# Patient Record
Sex: Female | Born: 1970 | Race: White | Hispanic: No | Marital: Married | Smoking: Never smoker
Health system: Southern US, Community
[De-identification: ages and names within clinical notes are randomized; demographics above are authoritative.]

## PROBLEM LIST (undated history)

## (undated) DIAGNOSIS — E079 Disorder of thyroid, unspecified: Secondary | ICD-10-CM

## (undated) DIAGNOSIS — I10 Essential (primary) hypertension: Secondary | ICD-10-CM

## (undated) DIAGNOSIS — D649 Anemia, unspecified: Secondary | ICD-10-CM

## (undated) DIAGNOSIS — C8190 Hodgkin lymphoma, unspecified, unspecified site: Principal | ICD-10-CM

## (undated) DIAGNOSIS — I499 Cardiac arrhythmia, unspecified: Secondary | ICD-10-CM

## (undated) HISTORY — DX: Cardiac arrhythmia, unspecified: I49.9

## (undated) HISTORY — PX: BREAST BIOPSY: SHX20

## (undated) HISTORY — DX: Hodgkin lymphoma, unspecified, unspecified site: C81.90

## (undated) HISTORY — DX: Essential (primary) hypertension: I10

## (undated) HISTORY — DX: Disorder of thyroid, unspecified: E07.9

## (undated) HISTORY — DX: Anemia, unspecified: D64.9

---

## 1997-09-26 ENCOUNTER — Inpatient Hospital Stay (HOSPITAL_COMMUNITY): Admission: AD | Admit: 1997-09-26 | Discharge: 1997-09-28 | Payer: Self-pay | Admitting: Obstetrics and Gynecology

## 1997-10-04 ENCOUNTER — Encounter: Admission: RE | Admit: 1997-10-04 | Discharge: 1998-01-02 | Payer: Self-pay | Admitting: Obstetrics and Gynecology

## 1997-10-29 ENCOUNTER — Other Ambulatory Visit: Admission: RE | Admit: 1997-10-29 | Discharge: 1997-10-29 | Payer: Self-pay | Admitting: Obstetrics and Gynecology

## 1998-01-08 ENCOUNTER — Encounter (HOSPITAL_COMMUNITY): Admission: RE | Admit: 1998-01-08 | Discharge: 1998-04-08 | Payer: Self-pay | Admitting: *Deleted

## 1998-12-16 ENCOUNTER — Other Ambulatory Visit: Admission: RE | Admit: 1998-12-16 | Discharge: 1998-12-16 | Payer: Self-pay | Admitting: Obstetrics and Gynecology

## 2000-02-15 ENCOUNTER — Other Ambulatory Visit: Admission: RE | Admit: 2000-02-15 | Discharge: 2000-02-15 | Payer: Self-pay | Admitting: Obstetrics and Gynecology

## 2000-02-23 ENCOUNTER — Encounter: Payer: Self-pay | Admitting: Obstetrics and Gynecology

## 2000-02-23 ENCOUNTER — Ambulatory Visit (HOSPITAL_COMMUNITY): Admission: RE | Admit: 2000-02-23 | Discharge: 2000-02-23 | Payer: Self-pay | Admitting: Obstetrics and Gynecology

## 2000-07-24 ENCOUNTER — Ambulatory Visit (HOSPITAL_COMMUNITY): Admission: RE | Admit: 2000-07-24 | Discharge: 2000-07-24 | Payer: Self-pay | Admitting: Obstetrics and Gynecology

## 2000-10-04 ENCOUNTER — Inpatient Hospital Stay (HOSPITAL_COMMUNITY): Admission: AD | Admit: 2000-10-04 | Discharge: 2000-10-06 | Payer: Self-pay | Admitting: Obstetrics and Gynecology

## 2001-01-22 ENCOUNTER — Other Ambulatory Visit: Admission: RE | Admit: 2001-01-22 | Discharge: 2001-01-22 | Payer: Self-pay | Admitting: Obstetrics and Gynecology

## 2002-04-15 ENCOUNTER — Other Ambulatory Visit: Admission: RE | Admit: 2002-04-15 | Discharge: 2002-04-15 | Payer: Self-pay | Admitting: Obstetrics and Gynecology

## 2003-04-27 ENCOUNTER — Other Ambulatory Visit: Admission: RE | Admit: 2003-04-27 | Discharge: 2003-04-27 | Payer: Self-pay | Admitting: Obstetrics and Gynecology

## 2004-05-12 ENCOUNTER — Other Ambulatory Visit: Admission: RE | Admit: 2004-05-12 | Discharge: 2004-05-12 | Payer: Self-pay | Admitting: Obstetrics and Gynecology

## 2004-06-16 ENCOUNTER — Ambulatory Visit: Payer: Self-pay | Admitting: Family Medicine

## 2004-12-25 ENCOUNTER — Ambulatory Visit: Payer: Self-pay | Admitting: Family Medicine

## 2005-02-02 ENCOUNTER — Ambulatory Visit: Payer: Self-pay | Admitting: Family Medicine

## 2005-06-05 ENCOUNTER — Other Ambulatory Visit: Admission: RE | Admit: 2005-06-05 | Discharge: 2005-06-05 | Payer: Self-pay | Admitting: Obstetrics and Gynecology

## 2005-06-08 ENCOUNTER — Ambulatory Visit: Payer: Self-pay | Admitting: Family Medicine

## 2005-08-20 ENCOUNTER — Ambulatory Visit: Payer: Self-pay | Admitting: Family Medicine

## 2005-09-10 ENCOUNTER — Ambulatory Visit: Payer: Self-pay | Admitting: Family Medicine

## 2006-07-03 ENCOUNTER — Encounter: Admission: RE | Admit: 2006-07-03 | Discharge: 2006-07-03 | Payer: Self-pay | Admitting: Obstetrics and Gynecology

## 2006-07-03 ENCOUNTER — Encounter (INDEPENDENT_AMBULATORY_CARE_PROVIDER_SITE_OTHER): Payer: Self-pay | Admitting: *Deleted

## 2006-07-05 ENCOUNTER — Ambulatory Visit: Payer: Self-pay | Admitting: Hematology & Oncology

## 2006-07-12 ENCOUNTER — Ambulatory Visit (HOSPITAL_COMMUNITY): Admission: RE | Admit: 2006-07-12 | Discharge: 2006-07-12 | Payer: Self-pay | Admitting: Hematology & Oncology

## 2006-07-23 LAB — CBC WITH DIFFERENTIAL/PLATELET
BASO%: 1.5 % (ref 0.0–2.0)
Basophils Absolute: 0.1 10*3/uL (ref 0.0–0.1)
EOS%: 2.1 % (ref 0.0–7.0)
HGB: 13.1 g/dL (ref 11.6–15.9)
MCH: 29.5 pg (ref 26.0–34.0)
MCHC: 33 g/dL (ref 32.0–36.0)
MONO%: 9.3 % (ref 0.0–13.0)
RBC: 4.43 10*6/uL (ref 3.70–5.32)
RDW: 11.9 % (ref 11.3–14.5)
lymph#: 1.4 10*3/uL (ref 0.9–3.3)

## 2006-07-23 LAB — COMPREHENSIVE METABOLIC PANEL
ALT: 12 U/L (ref 0–35)
AST: 11 U/L (ref 0–37)
Albumin: 4.6 g/dL (ref 3.5–5.2)
Alkaline Phosphatase: 59 U/L (ref 39–117)
Calcium: 9.6 mg/dL (ref 8.4–10.5)
Chloride: 102 mEq/L (ref 96–112)
Potassium: 4.2 mEq/L (ref 3.5–5.3)
Sodium: 138 mEq/L (ref 135–145)
Total Protein: 7.4 g/dL (ref 6.0–8.3)

## 2006-07-24 ENCOUNTER — Ambulatory Visit (HOSPITAL_COMMUNITY): Admission: RE | Admit: 2006-07-24 | Discharge: 2006-07-24 | Payer: Self-pay | Admitting: Hematology & Oncology

## 2006-08-08 LAB — BASIC METABOLIC PANEL
BUN: 8 mg/dL (ref 6–23)
Calcium: 9.1 mg/dL (ref 8.4–10.5)
Glucose, Bld: 94 mg/dL (ref 70–99)
Potassium: 4 mEq/L (ref 3.5–5.3)
Sodium: 140 mEq/L (ref 135–145)

## 2006-08-08 LAB — CBC WITH DIFFERENTIAL/PLATELET
EOS%: 1.4 % (ref 0.0–7.0)
LYMPH%: 59.7 % — ABNORMAL HIGH (ref 14.0–48.0)
MCH: 30.5 pg (ref 26.0–34.0)
MCV: 87.5 fL (ref 81.0–101.0)
MONO%: 23.3 % — ABNORMAL HIGH (ref 0.0–13.0)
Platelets: 323 10*3/uL (ref 145–400)
RBC: 3.85 10*6/uL (ref 3.70–5.32)
RDW: 11.9 % (ref 11.3–14.5)

## 2006-08-08 LAB — LACTATE DEHYDROGENASE: LDH: 133 U/L (ref 94–250)

## 2006-08-19 ENCOUNTER — Ambulatory Visit: Payer: Self-pay | Admitting: Hematology & Oncology

## 2006-08-22 LAB — CBC WITH DIFFERENTIAL/PLATELET
BASO%: 1.4 % (ref 0.0–2.0)
EOS%: 0.5 % (ref 0.0–7.0)
HCT: 37 % (ref 34.8–46.6)
HGB: 12.8 g/dL (ref 11.6–15.9)
MCH: 30.8 pg (ref 26.0–34.0)
MCHC: 34.7 g/dL (ref 32.0–36.0)
MONO#: 0.9 10*3/uL (ref 0.1–0.9)
NEUT%: 70.1 % (ref 39.6–76.8)
RDW: 12 % (ref 11.3–14.5)
WBC: 14.1 10*3/uL — ABNORMAL HIGH (ref 3.9–10.0)
lymph#: 3.1 10*3/uL (ref 0.9–3.3)

## 2006-09-05 LAB — CBC WITH DIFFERENTIAL/PLATELET
BASO%: 1.7 % (ref 0.0–2.0)
EOS%: 1.6 % (ref 0.0–7.0)
HCT: 35.6 % (ref 34.8–46.6)
LYMPH%: 17.5 % (ref 14.0–48.0)
MCH: 30.1 pg (ref 26.0–34.0)
MCHC: 33.9 g/dL (ref 32.0–36.0)
MCV: 88.7 fL (ref 81.0–101.0)
MONO#: 0.8 10*3/uL (ref 0.1–0.9)
MONO%: 5.6 % (ref 0.0–13.0)
NEUT%: 73.6 % (ref 39.6–76.8)
Platelets: 305 10*3/uL (ref 145–400)
RBC: 4.02 10*6/uL (ref 3.70–5.32)
WBC: 14.6 10*3/uL — ABNORMAL HIGH (ref 3.9–10.0)

## 2006-09-18 LAB — CBC WITH DIFFERENTIAL/PLATELET
BASO%: 0.3 % (ref 0.0–2.0)
EOS%: 0.6 % (ref 0.0–7.0)
HCT: 33.7 % — ABNORMAL LOW (ref 34.8–46.6)
MCH: 30.9 pg (ref 26.0–34.0)
MCHC: 34.6 g/dL (ref 32.0–36.0)
MONO#: 0.8 10*3/uL (ref 0.1–0.9)
NEUT%: 88.1 % — ABNORMAL HIGH (ref 39.6–76.8)
RBC: 3.78 10*6/uL (ref 3.70–5.32)
WBC: 27.5 10*3/uL — ABNORMAL HIGH (ref 3.9–10.0)
lymph#: 2.2 10*3/uL (ref 0.9–3.3)

## 2006-09-18 LAB — LACTATE DEHYDROGENASE: LDH: 266 U/L — ABNORMAL HIGH (ref 94–250)

## 2006-09-30 ENCOUNTER — Ambulatory Visit: Payer: Self-pay | Admitting: Hematology & Oncology

## 2006-10-03 LAB — CBC WITH DIFFERENTIAL/PLATELET
EOS%: 1.6 % (ref 0.0–7.0)
Eosinophils Absolute: 0.5 10*3/uL (ref 0.0–0.5)
HGB: 12.4 g/dL (ref 11.6–15.9)
MCH: 30.8 pg (ref 26.0–34.0)
MCV: 90 fL (ref 81.0–101.0)
MONO%: 5.3 % (ref 0.0–13.0)
NEUT#: 22.8 10*3/uL — ABNORMAL HIGH (ref 1.5–6.5)
RBC: 4.02 10*6/uL (ref 3.70–5.32)
RDW: 15.9 % — ABNORMAL HIGH (ref 11.3–14.5)
lymph#: 3.1 10*3/uL (ref 0.9–3.3)

## 2006-10-21 ENCOUNTER — Ambulatory Visit (HOSPITAL_COMMUNITY): Admission: RE | Admit: 2006-10-21 | Discharge: 2006-10-21 | Payer: Self-pay | Admitting: Hematology & Oncology

## 2006-10-28 LAB — CBC WITH DIFFERENTIAL/PLATELET
Basophils Absolute: 0.2 10*3/uL — ABNORMAL HIGH (ref 0.0–0.1)
Eosinophils Absolute: 0.1 10*3/uL (ref 0.0–0.5)
HGB: 11.1 g/dL — ABNORMAL LOW (ref 11.6–15.9)
MONO#: 1.4 10*3/uL — ABNORMAL HIGH (ref 0.1–0.9)
NEUT#: 7.3 10*3/uL — ABNORMAL HIGH (ref 1.5–6.5)
RBC: 3.8 10*6/uL (ref 3.70–5.32)
RDW: 14.5 % (ref 11.3–14.5)
WBC: 11.2 10*3/uL — ABNORMAL HIGH (ref 3.9–10.0)

## 2006-11-04 ENCOUNTER — Ambulatory Visit: Admission: RE | Admit: 2006-11-04 | Discharge: 2006-12-22 | Payer: Self-pay | Admitting: Radiation Oncology

## 2006-11-18 ENCOUNTER — Ambulatory Visit: Payer: Self-pay | Admitting: Hematology & Oncology

## 2006-12-09 LAB — COMPREHENSIVE METABOLIC PANEL
BUN: 11 mg/dL (ref 6–23)
CO2: 25 mEq/L (ref 19–32)
Calcium: 8.6 mg/dL (ref 8.4–10.5)
Chloride: 106 mEq/L (ref 96–112)
Creatinine, Ser: 0.61 mg/dL (ref 0.40–1.20)

## 2006-12-09 LAB — CBC WITH DIFFERENTIAL/PLATELET
Basophils Absolute: 0 10*3/uL (ref 0.0–0.1)
Eosinophils Absolute: 0.3 10*3/uL (ref 0.0–0.5)
HCT: 35.2 % (ref 34.8–46.6)
HGB: 12.1 g/dL (ref 11.6–15.9)
LYMPH%: 28.6 % (ref 14.0–48.0)
MONO#: 0.3 10*3/uL (ref 0.1–0.9)
NEUT#: 2.2 10*3/uL (ref 1.5–6.5)
NEUT%: 55.7 % (ref 39.6–76.8)
Platelets: 230 10*3/uL (ref 145–400)
WBC: 3.9 10*3/uL (ref 3.9–10.0)

## 2006-12-09 LAB — LACTATE DEHYDROGENASE: LDH: 151 U/L (ref 94–250)

## 2007-01-06 ENCOUNTER — Ambulatory Visit: Payer: Self-pay | Admitting: Hematology & Oncology

## 2007-01-31 ENCOUNTER — Ambulatory Visit (HOSPITAL_COMMUNITY): Admission: RE | Admit: 2007-01-31 | Discharge: 2007-01-31 | Payer: Self-pay | Admitting: Hematology & Oncology

## 2007-02-03 LAB — CBC WITH DIFFERENTIAL/PLATELET
Basophils Absolute: 0 10*3/uL (ref 0.0–0.1)
EOS%: 4.3 % (ref 0.0–7.0)
HGB: 11.9 g/dL (ref 11.6–15.9)
MCH: 29 pg (ref 26.0–34.0)
NEUT#: 2.4 10*3/uL (ref 1.5–6.5)
RBC: 4.1 10*6/uL (ref 3.70–5.32)
RDW: 15.4 % — ABNORMAL HIGH (ref 11.3–14.5)
lymph#: 1.2 10*3/uL (ref 0.9–3.3)

## 2007-02-03 LAB — COMPREHENSIVE METABOLIC PANEL
ALT: 12 U/L (ref 0–35)
AST: 12 U/L (ref 0–37)
Albumin: 4 g/dL (ref 3.5–5.2)
BUN: 10 mg/dL (ref 6–23)
Calcium: 9 mg/dL (ref 8.4–10.5)
Chloride: 107 mEq/L (ref 96–112)
Potassium: 3.5 mEq/L (ref 3.5–5.3)
Sodium: 142 mEq/L (ref 135–145)
Total Protein: 6.5 g/dL (ref 6.0–8.3)

## 2007-03-30 ENCOUNTER — Ambulatory Visit: Payer: Self-pay | Admitting: Hematology & Oncology

## 2007-05-05 ENCOUNTER — Ambulatory Visit (HOSPITAL_COMMUNITY): Admission: RE | Admit: 2007-05-05 | Discharge: 2007-05-05 | Payer: Self-pay | Admitting: Hematology & Oncology

## 2007-05-12 ENCOUNTER — Ambulatory Visit: Payer: Self-pay | Admitting: Hematology & Oncology

## 2007-05-12 LAB — CBC WITH DIFFERENTIAL/PLATELET
Basophils Absolute: 0 10*3/uL (ref 0.0–0.1)
EOS%: 4.9 % (ref 0.0–7.0)
HGB: 13.6 g/dL (ref 11.6–15.9)
LYMPH%: 32.2 % (ref 14.0–48.0)
MCH: 31.6 pg (ref 26.0–34.0)
MCV: 89.5 fL (ref 81.0–101.0)
MONO%: 8.5 % (ref 0.0–13.0)
RBC: 4.29 10*6/uL (ref 3.70–5.32)
RDW: 13.3 % (ref 11.3–14.5)

## 2007-05-16 ENCOUNTER — Ambulatory Visit (HOSPITAL_COMMUNITY): Admission: RE | Admit: 2007-05-16 | Discharge: 2007-05-16 | Payer: Self-pay | Admitting: Hematology & Oncology

## 2007-08-04 ENCOUNTER — Ambulatory Visit (HOSPITAL_COMMUNITY): Admission: RE | Admit: 2007-08-04 | Discharge: 2007-08-04 | Payer: Self-pay | Admitting: Hematology & Oncology

## 2007-08-06 ENCOUNTER — Ambulatory Visit: Payer: Self-pay | Admitting: Hematology & Oncology

## 2007-08-11 LAB — ERYTHROCYTE SEDIMENTATION RATE: Sed Rate: 5 mm/hr (ref 0–30)

## 2007-08-11 LAB — CBC WITH DIFFERENTIAL/PLATELET
BASO%: 0.9 % (ref 0.0–2.0)
EOS%: 4.2 % (ref 0.0–7.0)
Eosinophils Absolute: 0.2 10*3/uL (ref 0.0–0.5)
MCH: 30.6 pg (ref 26.0–34.0)
MCHC: 34.3 g/dL (ref 32.0–36.0)
MCV: 89.1 fL (ref 81.0–101.0)
MONO%: 6.3 % (ref 0.0–13.0)
NEUT#: 2 10*3/uL (ref 1.5–6.5)
RBC: 4.5 10*6/uL (ref 3.70–5.32)
RDW: 13.1 % (ref 11.3–14.5)

## 2007-08-11 LAB — LACTATE DEHYDROGENASE: LDH: 132 U/L (ref 94–250)

## 2007-11-03 ENCOUNTER — Ambulatory Visit (HOSPITAL_COMMUNITY): Admission: RE | Admit: 2007-11-03 | Discharge: 2007-11-03 | Payer: Self-pay | Admitting: Hematology & Oncology

## 2007-11-06 ENCOUNTER — Ambulatory Visit: Payer: Self-pay | Admitting: Hematology & Oncology

## 2007-11-10 LAB — CBC WITH DIFFERENTIAL/PLATELET
Eosinophils Absolute: 0.2 10*3/uL (ref 0.0–0.5)
HCT: 36.2 % (ref 34.8–46.6)
LYMPH%: 41.8 % (ref 14.0–48.0)
MONO#: 0.2 10*3/uL (ref 0.1–0.9)
NEUT#: 1.2 10*3/uL — ABNORMAL LOW (ref 1.5–6.5)
NEUT%: 43.9 % (ref 39.6–76.8)
Platelets: 229 10*3/uL (ref 145–400)
WBC: 2.8 10*3/uL — ABNORMAL LOW (ref 3.9–10.0)

## 2007-11-10 LAB — ERYTHROCYTE SEDIMENTATION RATE: Sed Rate: 6 mm/hr (ref 0–30)

## 2007-11-10 LAB — COMPREHENSIVE METABOLIC PANEL
ALT: 9 U/L (ref 0–35)
Albumin: 4.2 g/dL (ref 3.5–5.2)
BUN: 12 mg/dL (ref 6–23)
CO2: 25 mEq/L (ref 19–32)
Calcium: 9.4 mg/dL (ref 8.4–10.5)
Chloride: 105 mEq/L (ref 96–112)
Creatinine, Ser: 0.73 mg/dL (ref 0.40–1.20)
Potassium: 4.1 mEq/L (ref 3.5–5.3)

## 2007-11-10 LAB — LACTATE DEHYDROGENASE: LDH: 117 U/L (ref 94–250)

## 2008-02-09 ENCOUNTER — Ambulatory Visit (HOSPITAL_COMMUNITY): Admission: RE | Admit: 2008-02-09 | Discharge: 2008-02-09 | Payer: Self-pay | Admitting: Hematology & Oncology

## 2008-02-11 ENCOUNTER — Ambulatory Visit: Payer: Self-pay | Admitting: Hematology & Oncology

## 2008-02-12 LAB — COMPREHENSIVE METABOLIC PANEL
BUN: 11 mg/dL (ref 6–23)
CO2: 23 mEq/L (ref 19–32)
Creatinine, Ser: 0.7 mg/dL (ref 0.40–1.20)
Glucose, Bld: 87 mg/dL (ref 70–99)
Total Bilirubin: 0.9 mg/dL (ref 0.3–1.2)

## 2008-04-09 IMAGING — CT NM PET TUM IMG SKULL BASE T - THIGH
4 series · 25 of 25 positions shown · IV contrast ([ID])
Comparison: No prior studies for comparison.

CLINICAL DATA: New diagnosis of Hodgkin?s lymphoma after core biopsy of an enlarged left axillary lymph node.  
 FDG PET-CT TUMOR IMAGING (SKULL BASE TO THIGHS):
 Fasting Blood Glucose:  91.
TECHNIQUE: 14 mCi F-18 FDG were administered via right antecubital fossa.   Full ring PET imaging was performed from the skull base through the mid-thighs 60 minutes after injection.  CT data was obtained and used for attenuation correction and anatomic localization only.  (This was not acquired as a diagnostic CT examination.)

[Series 1: pet ac · axial · 3.3mm · 4.69mm/px · z∈[-870,+0]mm · 8 of 267 slices shown]
[im 1/267]
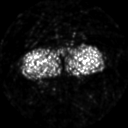
[im 39/267]
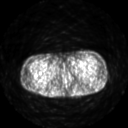
[im 77/267]
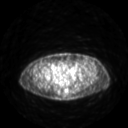
[im 115/267]
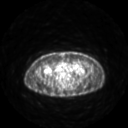
[im 153/267]
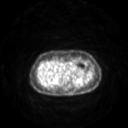
[im 191/267]
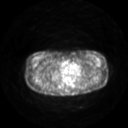
[im 229/267]
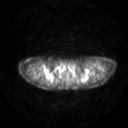
[im 267/267]
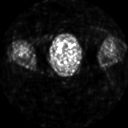

[Series 2: pet nac · axial · 3.3mm · 4.69mm/px · z∈[-870,+0]mm · 8 of 267 slices shown]
[im 1/267]
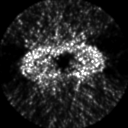
[im 39/267]
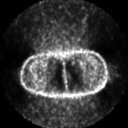
[im 77/267]
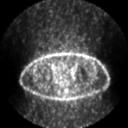
[im 115/267]
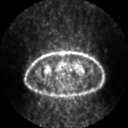
[im 153/267]
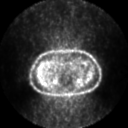
[im 191/267]
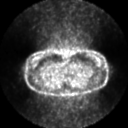
[im 229/267]
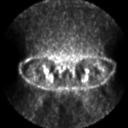
[im 267/267]
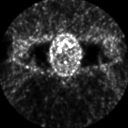

[Series 2: ct images · axial · 3.8mm · 0.98mm/px · z∈[-870,+0]mm · 8 of 267 slices shown]
[im 1/267]
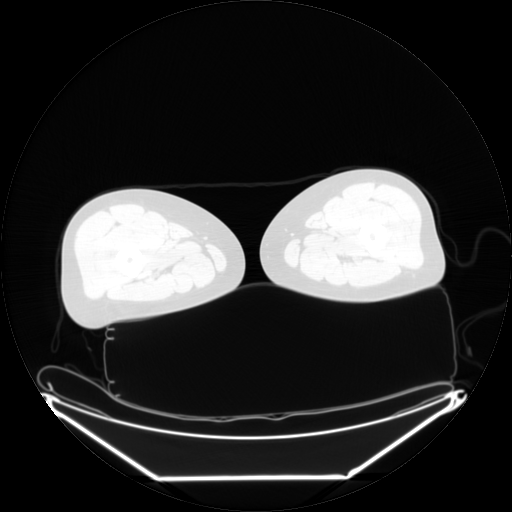
[im 39/267]
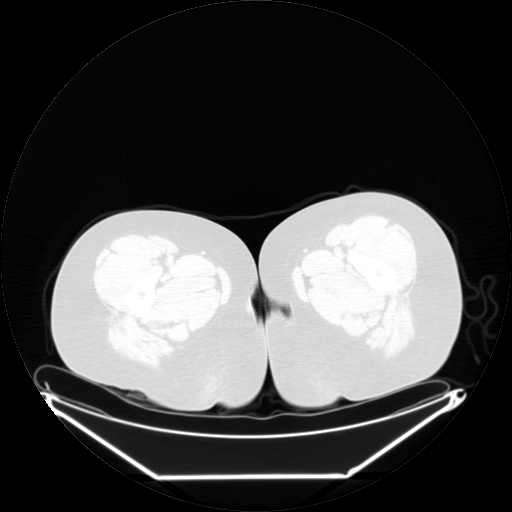
[im 77/267]
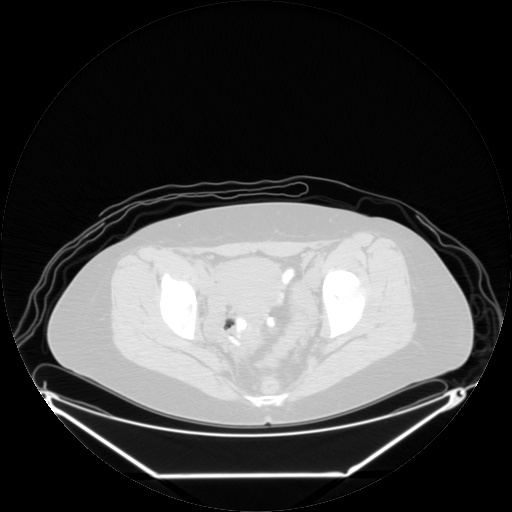
[im 115/267]
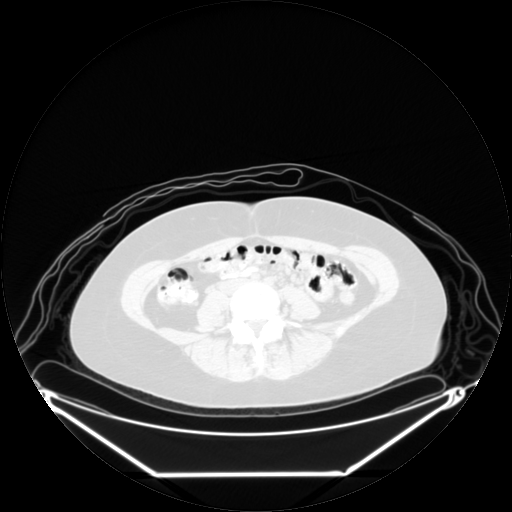
[im 153/267]
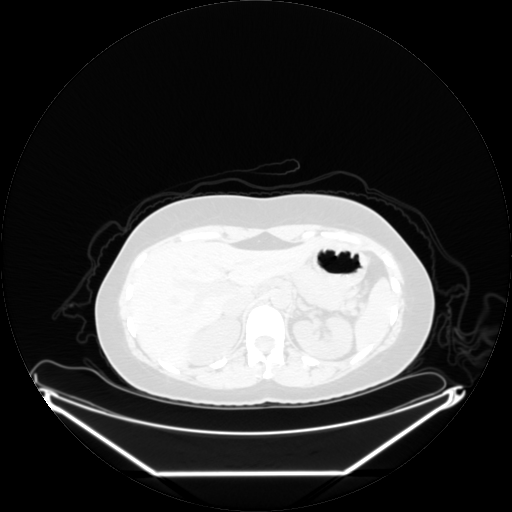
[im 191/267]
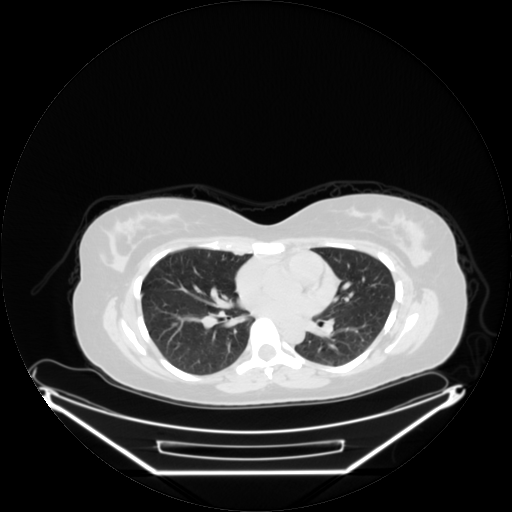
[im 229/267]
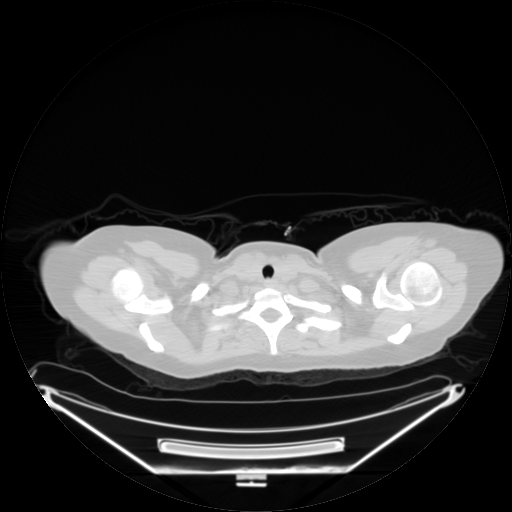
[im 267/267]
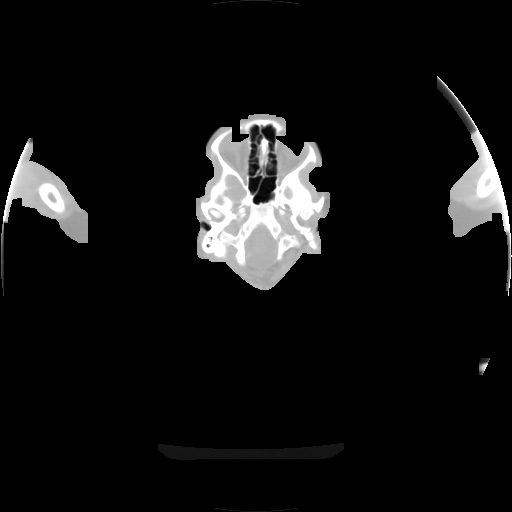

[Series 123: mip · coronal · 3.3mm · 4.69mm/px · 1 of 30 slices shown]
[im 1/30]
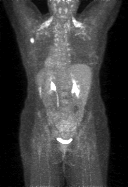

[25 of 25 positions shown; findings below may reference images not displayed]

FINDINGS: There is a dominant enlarged left axillary lymph node present measuring 2.2 cm in greatest transverse dimensions.   This demonstrates increased metabolic activity by PET with SUV max of 5.7.  An adjacent smaller medial axillary node in a subpectoral location measures approximately 1.4 cm in greatest diameter.  This shows no evidence of abnormal metabolic activity by PET with measured SUV max of 1.1.  
 Physiologic activity is seen in brown fat in the supraclavicular regions bilaterally.   No other abnormal lymph node activity is seen in the neck, chest, abdomen or pelvis.  No abnormal solid organ activity is present.   No bony lesions.   No pulmonary masses.  No focal abnormal activity is seen in the GI tract.
IMPRESSION: The only abnormal metabolic activity present is in the dominant enlarged left axillary lymph node with SUV max of 5.7.  A smaller adjacent medial axillary/subpectoral node shows no abnormal metabolic activity by PET.  No other evidence of active lymphoma is seen throughout the rest of the imaged body.

## 2008-06-14 ENCOUNTER — Ambulatory Visit (HOSPITAL_COMMUNITY): Admission: RE | Admit: 2008-06-14 | Discharge: 2008-06-14 | Payer: Self-pay | Admitting: Hematology & Oncology

## 2008-06-16 ENCOUNTER — Ambulatory Visit: Payer: Self-pay | Admitting: Hematology & Oncology

## 2008-06-17 LAB — CBC WITH DIFFERENTIAL (CANCER CENTER ONLY)
BASO%: 0.7 % (ref 0.0–2.0)
Eosinophils Absolute: 0.1 10*3/uL (ref 0.0–0.5)
HCT: 39 % (ref 34.8–46.6)
HGB: 13.4 g/dL (ref 11.6–15.9)
LYMPH#: 1.8 10*3/uL (ref 0.9–3.3)
LYMPH%: 38.5 % (ref 14.0–48.0)
MCV: 89 fL (ref 81–101)
MONO#: 0.4 10*3/uL (ref 0.1–0.9)
NEUT%: 50.7 % (ref 39.6–80.0)
RBC: 4.39 10*6/uL (ref 3.70–5.32)
RDW: 12.4 % (ref 10.5–14.6)
WBC: 4.8 10*3/uL (ref 3.9–10.0)

## 2008-06-17 LAB — COMPREHENSIVE METABOLIC PANEL
ALT: 12 U/L (ref 0–35)
Alkaline Phosphatase: 55 U/L (ref 39–117)
CO2: 25 mEq/L (ref 19–32)
Sodium: 138 mEq/L (ref 135–145)
Total Bilirubin: 0.9 mg/dL (ref 0.3–1.2)
Total Protein: 7 g/dL (ref 6.0–8.3)

## 2008-10-04 ENCOUNTER — Ambulatory Visit (HOSPITAL_COMMUNITY): Admission: RE | Admit: 2008-10-04 | Discharge: 2008-10-04 | Payer: Self-pay | Admitting: Hematology & Oncology

## 2008-10-05 ENCOUNTER — Ambulatory Visit: Payer: Self-pay | Admitting: Hematology & Oncology

## 2008-10-06 LAB — CBC WITH DIFFERENTIAL (CANCER CENTER ONLY)
BASO#: 0 10*3/uL (ref 0.0–0.2)
EOS%: 3.8 % (ref 0.0–7.0)
Eosinophils Absolute: 0.2 10*3/uL (ref 0.0–0.5)
HGB: 13.4 g/dL (ref 11.6–15.9)
LYMPH#: 1.8 10*3/uL (ref 0.9–3.3)
MONO%: 6.5 % (ref 0.0–13.0)
NEUT#: 2.9 10*3/uL (ref 1.5–6.5)
Platelets: 268 10*3/uL (ref 145–400)
RBC: 4.38 10*6/uL (ref 3.70–5.32)

## 2008-10-06 LAB — COMPREHENSIVE METABOLIC PANEL
Albumin: 4.4 g/dL (ref 3.5–5.2)
CO2: 23 mEq/L (ref 19–32)
Glucose, Bld: 69 mg/dL — ABNORMAL LOW (ref 70–99)
Sodium: 136 mEq/L (ref 135–145)
Total Bilirubin: 1 mg/dL (ref 0.3–1.2)
Total Protein: 7 g/dL (ref 6.0–8.3)

## 2008-10-06 LAB — LACTATE DEHYDROGENASE: LDH: 127 U/L (ref 94–250)

## 2009-03-14 ENCOUNTER — Ambulatory Visit (HOSPITAL_COMMUNITY): Admission: RE | Admit: 2009-03-14 | Discharge: 2009-03-14 | Payer: Self-pay | Admitting: Hematology & Oncology

## 2009-03-16 ENCOUNTER — Ambulatory Visit: Payer: Self-pay | Admitting: Hematology & Oncology

## 2009-03-17 LAB — COMPREHENSIVE METABOLIC PANEL
AST: 14 U/L (ref 0–37)
Alkaline Phosphatase: 54 U/L (ref 39–117)
Glucose, Bld: 84 mg/dL (ref 70–99)
Sodium: 137 mEq/L (ref 135–145)
Total Bilirubin: 0.7 mg/dL (ref 0.3–1.2)
Total Protein: 7.2 g/dL (ref 6.0–8.3)

## 2009-03-17 LAB — CBC WITH DIFFERENTIAL (CANCER CENTER ONLY)
BASO%: 0.6 % (ref 0.0–2.0)
EOS%: 3.9 % (ref 0.0–7.0)
LYMPH#: 2.3 10*3/uL (ref 0.9–3.3)
MCH: 30.4 pg (ref 26.0–34.0)
MCHC: 33.7 g/dL (ref 32.0–36.0)
MONO%: 7.1 % (ref 0.0–13.0)
NEUT#: 2.9 10*3/uL (ref 1.5–6.5)
Platelets: 276 10*3/uL (ref 145–400)
RDW: 12.1 % (ref 10.5–14.6)

## 2009-09-12 ENCOUNTER — Ambulatory Visit (HOSPITAL_COMMUNITY): Admission: RE | Admit: 2009-09-12 | Discharge: 2009-09-12 | Payer: Self-pay | Admitting: Hematology & Oncology

## 2009-09-14 ENCOUNTER — Ambulatory Visit: Payer: Self-pay | Admitting: Hematology & Oncology

## 2009-09-15 LAB — CBC WITH DIFFERENTIAL (CANCER CENTER ONLY)
BASO%: 0.5 % (ref 0.0–2.0)
Eosinophils Absolute: 0.2 10*3/uL (ref 0.0–0.5)
HCT: 37 % (ref 34.8–46.6)
HGB: 12.6 g/dL (ref 11.6–15.9)
LYMPH#: 2.4 10*3/uL (ref 0.9–3.3)
MONO#: 0.5 10*3/uL (ref 0.1–0.9)
NEUT%: 49.7 % (ref 39.6–80.0)
RBC: 4.18 10*6/uL (ref 3.70–5.32)
RDW: 11.7 % (ref 10.5–14.6)

## 2009-09-15 LAB — COMPREHENSIVE METABOLIC PANEL
ALT: 13 U/L (ref 0–35)
Albumin: 4.5 g/dL (ref 3.5–5.2)
CO2: 25 mEq/L (ref 19–32)
Calcium: 9 mg/dL (ref 8.4–10.5)
Chloride: 102 mEq/L (ref 96–112)
Creatinine, Ser: 0.73 mg/dL (ref 0.40–1.20)
Glucose, Bld: 78 mg/dL (ref 70–99)
Sodium: 136 mEq/L (ref 135–145)

## 2010-03-15 ENCOUNTER — Ambulatory Visit (HOSPITAL_BASED_OUTPATIENT_CLINIC_OR_DEPARTMENT_OTHER): Payer: BC Managed Care – PPO | Admitting: Hematology & Oncology

## 2010-03-16 LAB — COMPREHENSIVE METABOLIC PANEL
ALT: 17 U/L (ref 0–35)
AST: 15 U/L (ref 0–37)
Alkaline Phosphatase: 50 U/L (ref 39–117)
CO2: 23 mEq/L (ref 19–32)
Calcium: 9 mg/dL (ref 8.4–10.5)
Creatinine, Ser: 0.8 mg/dL (ref 0.40–1.20)
Glucose, Bld: 81 mg/dL (ref 70–99)
Total Bilirubin: 0.6 mg/dL (ref 0.3–1.2)

## 2010-03-16 LAB — CBC WITH DIFFERENTIAL (CANCER CENTER ONLY)
BASO%: 0.8 % (ref 0.0–2.0)
EOS%: 3.7 % (ref 0.0–7.0)
LYMPH#: 2.3 10*3/uL (ref 0.9–3.3)
LYMPH%: 30.6 % (ref 14.0–48.0)
MCHC: 34.3 g/dL (ref 32.0–36.0)
MONO#: 0.6 10*3/uL (ref 0.1–0.9)
Platelets: 312 10*3/uL (ref 145–400)
RDW: 12.4 % (ref 10.5–14.6)
WBC: 7.7 10*3/uL (ref 3.9–10.0)

## 2010-03-16 LAB — VITAMIN D 25 HYDROXY (VIT D DEFICIENCY, FRACTURES): Vit D, 25-Hydroxy: 42 ng/mL (ref 30–89)

## 2010-03-20 ENCOUNTER — Ambulatory Visit (HOSPITAL_COMMUNITY): Admission: RE | Admit: 2010-03-20 | Discharge: 2010-03-20 | Payer: Self-pay | Admitting: Hematology & Oncology

## 2010-07-24 ENCOUNTER — Emergency Department (HOSPITAL_COMMUNITY)
Admission: EM | Admit: 2010-07-24 | Discharge: 2010-07-24 | Payer: Self-pay | Source: Home / Self Care | Admitting: Emergency Medicine

## 2010-08-27 ENCOUNTER — Encounter: Payer: Self-pay | Admitting: Hematology & Oncology

## 2010-09-21 ENCOUNTER — Encounter: Payer: BC Managed Care – PPO | Admitting: Hematology & Oncology

## 2010-09-21 ENCOUNTER — Encounter (HOSPITAL_BASED_OUTPATIENT_CLINIC_OR_DEPARTMENT_OTHER): Payer: BC Managed Care – PPO | Admitting: Hematology & Oncology

## 2010-09-21 DIAGNOSIS — C8114 Nodular sclerosis classical Hodgkin lymphoma, lymph nodes of axilla and upper limb: Secondary | ICD-10-CM

## 2010-09-21 DIAGNOSIS — K7689 Other specified diseases of liver: Secondary | ICD-10-CM

## 2010-09-21 LAB — CBC WITH DIFFERENTIAL (CANCER CENTER ONLY)
BASO#: 0 10*3/uL (ref 0.0–0.2)
EOS%: 4.7 % (ref 0.0–7.0)
HCT: 37.8 % (ref 34.8–46.6)
HGB: 12.7 g/dL (ref 11.6–15.9)
LYMPH%: 34.2 % (ref 14.0–48.0)
MCH: 29.8 pg (ref 26.0–34.0)
MCHC: 33.6 g/dL (ref 32.0–36.0)
MCV: 89 fL (ref 81–101)
MONO%: 7 % (ref 0.0–13.0)
NEUT%: 53.4 % (ref 39.6–80.0)
RDW: 12.1 % (ref 10.5–14.6)

## 2010-09-22 LAB — VITAMIN D 25 HYDROXY (VIT D DEFICIENCY, FRACTURES): Vit D, 25-Hydroxy: 57 ng/mL (ref 30–89)

## 2010-09-22 LAB — COMPREHENSIVE METABOLIC PANEL
ALT: 15 U/L (ref 0–35)
AST: 14 U/L (ref 0–37)
Albumin: 4.4 g/dL (ref 3.5–5.2)
Alkaline Phosphatase: 57 U/L (ref 39–117)
Calcium: 9 mg/dL (ref 8.4–10.5)
Potassium: 3.8 mEq/L (ref 3.5–5.3)
Sodium: 137 mEq/L (ref 135–145)
Total Protein: 7 g/dL (ref 6.0–8.3)

## 2010-10-16 LAB — CBC
Platelets: 271 10*3/uL (ref 150–400)
RDW: 13.3 % (ref 11.5–15.5)
WBC: 5.1 10*3/uL (ref 4.0–10.5)

## 2010-10-16 LAB — POCT PREGNANCY, URINE: Preg Test, Ur: NEGATIVE

## 2010-10-16 LAB — POCT I-STAT, CHEM 8
BUN: 12 mg/dL (ref 6–23)
Chloride: 102 mEq/L (ref 96–112)
Potassium: 5.1 mEq/L (ref 3.5–5.1)
Sodium: 136 mEq/L (ref 135–145)

## 2010-10-16 LAB — D-DIMER, QUANTITATIVE: D-Dimer, Quant: 0.23 ug/mL-FEU (ref 0.00–0.48)

## 2010-10-20 LAB — GLUCOSE, CAPILLARY: Glucose-Capillary: 99 mg/dL (ref 70–99)

## 2010-10-25 LAB — GLUCOSE, CAPILLARY: Glucose-Capillary: 91 mg/dL (ref 70–99)

## 2010-11-11 LAB — GLUCOSE, CAPILLARY: Glucose-Capillary: 96 mg/dL (ref 70–99)

## 2010-11-16 ENCOUNTER — Other Ambulatory Visit: Payer: Self-pay | Admitting: Surgery

## 2010-11-16 LAB — GLUCOSE, CAPILLARY: Glucose-Capillary: 89 mg/dL (ref 70–99)

## 2010-12-22 NOTE — Discharge Summary (Signed)
[facility]  Patient:    Jillian Barnes, Jillian Barnes                        MRN: [id number] Adm. Date:  [id number] Disc. Date: [id number] Attending:  Mickle Mallory                           Discharge Summary  DISCHARGE DIAGNOSES:          1. Term pregnancy delivered 6 pound 11 ounce                                  female infant Apgars 9 and 9.                               2. Blood type O-, RhoGAM given.                               3. Maternal hypothyroidism.  PROCEDURE:                    1. Normal spontaneous delivery.                               2. Repair of second degree tear.  SUMMARY:                      This 40 year old gravida 2, para 1 was admitted at term in labor after a benign pregnancy.  Estimated fetal weight was approximately 6.5-7 pounds.  Fetal heart rate was reactive at the time of admission.  Patient was 3 cm dilated with the vertex at -1 station at 10 a.m. on March 1.  Amniotomy was carried out on the afternoon of March 1 with production of clear fluid.  The patient requested and received an epidural. Later on that afternoon patient had a normal spontaneous delivery of a viable female infant with Apgars of 9 and 9.  Baby weighed 6 pounds 11 ounces.  There was a tear which was repaired without difficulty and the patient breast-fed. On the morning of March 2 hemoglobin was 10.9 with a Merk count of 10,400 and a platelet count of 214,000.  On the morning of March 3 patient was ready for discharge and accordingly was given all appropriate instructions.  Patient did receive RhoGAM.  DISCHARGE MEDICATIONS:        1. Tylox one to two q.4-6h.                               2. Motrin 600 mg q.6h. for less pain.                               3. Vitamins.                               4. Ferrous sulfate 300 mg q.d.  DISCHARGE INSTRUCTIONS:       She will breast-feed.  She was given all appropriate instructions at the time of discharge and  understood all instructions well.  FOLLOW-UP:                    In the office in four weeks time or as needed.  CONDITION ON DISCHARGE:       Improved. DD:  10/30/00 TD:  10/30/00 Job: [id number]

## 2011-04-05 ENCOUNTER — Other Ambulatory Visit: Payer: Self-pay | Admitting: Hematology & Oncology

## 2011-04-05 ENCOUNTER — Encounter (HOSPITAL_BASED_OUTPATIENT_CLINIC_OR_DEPARTMENT_OTHER): Payer: BC Managed Care – PPO | Admitting: Hematology & Oncology

## 2011-04-05 DIAGNOSIS — K7689 Other specified diseases of liver: Secondary | ICD-10-CM

## 2011-04-05 DIAGNOSIS — C8114 Nodular sclerosis classical Hodgkin lymphoma, lymph nodes of axilla and upper limb: Secondary | ICD-10-CM

## 2011-04-06 LAB — COMPREHENSIVE METABOLIC PANEL
Albumin: 4.2 g/dL (ref 3.5–5.2)
Alkaline Phosphatase: 52 U/L (ref 39–117)
BUN: 13 mg/dL (ref 6–23)
CO2: 26 mEq/L (ref 19–32)
Calcium: 9 mg/dL (ref 8.4–10.5)
Chloride: 103 mEq/L (ref 96–112)
Glucose, Bld: 77 mg/dL (ref 70–99)
Potassium: 3.9 mEq/L (ref 3.5–5.3)
Sodium: 138 mEq/L (ref 135–145)
Total Protein: 6.6 g/dL (ref 6.0–8.3)

## 2011-04-06 LAB — VITAMIN D 25 HYDROXY (VIT D DEFICIENCY, FRACTURES): Vit D, 25-Hydroxy: 52 ng/mL (ref 30–89)

## 2011-09-27 ENCOUNTER — Ambulatory Visit (HOSPITAL_BASED_OUTPATIENT_CLINIC_OR_DEPARTMENT_OTHER): Payer: BC Managed Care – PPO | Admitting: Hematology & Oncology

## 2011-09-27 ENCOUNTER — Telehealth: Payer: Self-pay | Admitting: Hematology & Oncology

## 2011-09-27 ENCOUNTER — Encounter: Payer: Self-pay | Admitting: Hematology & Oncology

## 2011-09-27 ENCOUNTER — Other Ambulatory Visit (HOSPITAL_BASED_OUTPATIENT_CLINIC_OR_DEPARTMENT_OTHER): Payer: BC Managed Care – PPO | Admitting: Lab

## 2011-09-27 DIAGNOSIS — Z8571 Personal history of Hodgkin lymphoma: Secondary | ICD-10-CM

## 2011-09-27 DIAGNOSIS — M818 Other osteoporosis without current pathological fracture: Secondary | ICD-10-CM

## 2011-09-27 DIAGNOSIS — C8190 Hodgkin lymphoma, unspecified, unspecified site: Secondary | ICD-10-CM | POA: Insufficient documentation

## 2011-09-27 DIAGNOSIS — E039 Hypothyroidism, unspecified: Secondary | ICD-10-CM

## 2011-09-27 HISTORY — DX: Hodgkin lymphoma, unspecified, unspecified site: C81.90

## 2011-09-27 LAB — CBC WITH DIFFERENTIAL (CANCER CENTER ONLY)
BASO#: 0.1 10*3/uL (ref 0.0–0.2)
EOS%: 4.7 % (ref 0.0–7.0)
Eosinophils Absolute: 0.3 10*3/uL (ref 0.0–0.5)
HCT: 38 % (ref 34.8–46.6)
HGB: 12.9 g/dL (ref 11.6–15.9)
LYMPH#: 2.2 10*3/uL (ref 0.9–3.3)
MCH: 30.5 pg (ref 26.0–34.0)
MCHC: 33.9 g/dL (ref 32.0–36.0)
NEUT#: 3.3 10*3/uL (ref 1.5–6.5)
NEUT%: 52.4 % (ref 39.6–80.0)
RBC: 4.23 10*6/uL (ref 3.70–5.32)

## 2011-09-27 NOTE — Progress Notes (Signed)
This office note has been dictated.

## 2011-09-27 NOTE — Telephone Encounter (Signed)
Mailed 09-2012 schedule °

## 2011-09-28 LAB — COMPREHENSIVE METABOLIC PANEL
AST: 14 U/L (ref 0–37)
Albumin: 4.2 g/dL (ref 3.5–5.2)
BUN: 12 mg/dL (ref 6–23)
Calcium: 9.4 mg/dL (ref 8.4–10.5)
Chloride: 103 mEq/L (ref 96–112)
Creatinine, Ser: 0.79 mg/dL (ref 0.50–1.10)
Glucose, Bld: 86 mg/dL (ref 70–99)
Potassium: 3.3 mEq/L — ABNORMAL LOW (ref 3.5–5.3)

## 2011-09-28 LAB — VITAMIN D 25 HYDROXY (VIT D DEFICIENCY, FRACTURES): Vit D, 25-Hydroxy: 77 ng/mL (ref 30–89)

## 2011-09-28 LAB — LACTATE DEHYDROGENASE: LDH: 141 U/L (ref 94–250)

## 2011-09-28 NOTE — Progress Notes (Signed)
CC:   Dina Rich, MD Malva Limes, M.D.  DIAGNOSIS:  Stage IA nodular sclerosing Hodgkin disease.  CURRENT THERAPY:  Observation.  INTERIM HISTORY:  Ms. Roes comes in for her routine followup.  She will be 5 years out at the end of this month from her treatment.  So far, there has been no evidence of recurrent disease.  She has done very well since we last saw her.  She has had no problems since we last saw her back in August.  She is eating well.  She has not had any problem with cough or shortness of breath.  There has been no bony pain.  There has been no change in bowel or bladder habits.  She is getting her routine mammograms.  She is making sure of this.  PHYSICAL EXAMINATION:  General:  This is a well-developed, well- nourished [place] female in no obvious distress.  Vital Signs: Temperature 97.9, pulse 90, respiratory rate 20, blood pressure 138/90, weight is 176.  Head and Neck Exam:  Shows a normocephalic, atraumatic skull.  There are no ocular or oral lesions.  There are no palpable cervical or supraclavicular lymph nodes.  Lungs:  Clear bilaterally. Cardiac Exam:  Regular rate and rhythm with a normal S1 and S2.  There are no murmurs, rubs, or bruits.  Abdominal Exam:  Soft with good bowel sounds.  There is no palpable abdominal mass.  There is no fluid wave. There is no palpable hepatosplenomegaly.  Axillary Exam:  Shows no bilateral axillary adenopathy.  Extremities:  Show no clubbing, cyanosis, or edema.  Skin Exam:  No rashes, ecchymoses, or petechiae.  LABORATORY STUDIES:  [place] cell count 6.4, hemoglobin 12.9, hematocrit 38, platelet count 286.  IMPRESSION:  Ms. Labrador is a 41 year old [place] female with a history of stage IA nodular sclerosing Hodgkin disease.  She underwent 2 cycles of ABVD, followed by radiation.  She completed all of her therapy back in April 2008.  At this point in time, I really believe that she is cured.  I believe that her risk of  recurrence is less than 5%.  We will go ahead and plan to get her back yearly now.  I need to make sure that she is diligent in taking her vitamin D, which I think is important.  I do not see a need for any x-ray studies at the present time.    ______________________________ Josph Macho, M.D. PRE/MEDQ  D:  09/27/2011  T:  09/28/2011  Job:  [id number]

## 2011-10-03 ENCOUNTER — Telehealth: Payer: Self-pay | Admitting: *Deleted

## 2011-10-03 NOTE — Telephone Encounter (Signed)
Message copied by Anselm Jungling on Wed Oct 03, 2011 11:13 AM ------      Message from: Arlan Organ R      Created: Mon Oct 01, 2011  7:04 PM       Call- labs and vit d are great!!  pete

## 2011-10-03 NOTE — Telephone Encounter (Signed)
Called patients personal answering machine to let her know that her labwork and vitamin D levels looked great per Dr. Myna Hidalgo

## 2012-08-07 ENCOUNTER — Telehealth: Payer: Self-pay | Admitting: Hematology & Oncology

## 2012-08-07 NOTE — Telephone Encounter (Signed)
Pt moved 2-20 to 10-10-12

## 2012-09-25 ENCOUNTER — Other Ambulatory Visit: Payer: BC Managed Care – PPO | Admitting: Lab

## 2012-09-25 ENCOUNTER — Ambulatory Visit: Payer: BC Managed Care – PPO | Admitting: Hematology & Oncology

## 2012-10-09 ENCOUNTER — Other Ambulatory Visit: Payer: Self-pay | Admitting: Medical

## 2012-10-09 DIAGNOSIS — C8190 Hodgkin lymphoma, unspecified, unspecified site: Secondary | ICD-10-CM

## 2012-10-10 ENCOUNTER — Other Ambulatory Visit: Payer: BC Managed Care – PPO | Admitting: Lab

## 2012-10-10 ENCOUNTER — Ambulatory Visit: Payer: BC Managed Care – PPO | Admitting: Medical

## 2012-10-13 ENCOUNTER — Telehealth: Payer: Self-pay | Admitting: Hematology & Oncology

## 2012-10-13 NOTE — Telephone Encounter (Signed)
Left pt message to call and reschedule 3-7

## 2012-10-14 ENCOUNTER — Telehealth: Payer: Self-pay | Admitting: Hematology & Oncology

## 2012-10-14 NOTE — Telephone Encounter (Signed)
Patient called to resch 10/10/12 apt.  She stated she wanted to see MD.  She resch apt for 11/28/12

## 2012-11-28 ENCOUNTER — Other Ambulatory Visit (HOSPITAL_BASED_OUTPATIENT_CLINIC_OR_DEPARTMENT_OTHER): Payer: BC Managed Care – PPO | Admitting: Lab

## 2012-11-28 ENCOUNTER — Ambulatory Visit (HOSPITAL_BASED_OUTPATIENT_CLINIC_OR_DEPARTMENT_OTHER): Payer: BC Managed Care – PPO | Admitting: Medical

## 2012-11-28 VITALS — BP 120/80 | HR 89 | Temp 98.2°F | Resp 16 | Ht 67.0 in | Wt 175.0 lb

## 2012-11-28 DIAGNOSIS — R5381 Other malaise: Secondary | ICD-10-CM

## 2012-11-28 DIAGNOSIS — C8190 Hodgkin lymphoma, unspecified, unspecified site: Secondary | ICD-10-CM

## 2012-11-28 DIAGNOSIS — Z8571 Personal history of Hodgkin lymphoma: Secondary | ICD-10-CM

## 2012-11-28 LAB — CBC WITH DIFFERENTIAL (CANCER CENTER ONLY)
BASO%: 1.2 % (ref 0.0–2.0)
EOS%: 3.1 % (ref 0.0–7.0)
HCT: 38 % (ref 34.8–46.6)
LYMPH%: 36.8 % (ref 14.0–48.0)
MCHC: 32.9 g/dL (ref 32.0–36.0)
MCV: 91 fL (ref 81–101)
MONO#: 0.6 10*3/uL (ref 0.1–0.9)
NEUT%: 49.7 % (ref 39.6–80.0)
Platelets: 297 10*3/uL (ref 145–400)
RDW: 13.3 % (ref 11.1–15.7)
WBC: 6.1 10*3/uL (ref 3.9–10.0)

## 2012-11-28 NOTE — Progress Notes (Signed)
DIAGNOSIS:  Stage IA nodular sclerosing Hodgkin disease.  CURRENT THERAPY:  Observation.  INTERIM HISTORY:  Jillian Barnes presents for an office followup visit.  Today.  Her husband accompanies her.  Overall, she, reports, that she's been doing quite well.  She's not reported any new problems.  She does complain of some fatigue.  She does have hypothyroidism.  She is on Synthroid.  She, reports she's not had a TSH checked in a while.  She is going to followup with her primary care physician to get this checked.  She's not reporting any problems with cough, or shortness of breath.  She denies any bony, pain.  She does not report any rashes, or palpable lymph nodes.  She's not reported any recurrent infections.  She has a good appetite.  She denies any nausea, vomiting, diarrhea, constipation, chest pain, shortness of breath, or cough.  She denies any fevers, chills, or night sweats.  She denies any abdominal pain, any lower leg swelling.  She denies any obvious, or abnormal bleeding.  She denies any headaches, visual changes, or rashes.  Review of Systems: Constitutional:Negative for malaise/fatigue, fever, chills, weight loss, diaphoresis, activity change, appetite change, and unexpected weight change.  HEENT: Negative for double vision, blurred vision, visual loss, ear pain, tinnitus, congestion, rhinorrhea, epistaxis sore throat or sinus disease, oral pain/lesion, tongue soreness Respiratory: Negative for cough, chest tightness, shortness of breath, wheezing and stridor.  Cardiovascular: Negative for chest pain, palpitations, leg swelling, orthopnea, PND, DOE or claudication Gastrointestinal: Negative for nausea, vomiting, abdominal pain, diarrhea, constipation, blood in stool, melena, hematochezia, abdominal distention, anal bleeding, rectal pain, anorexia and hematemesis.  Genitourinary: Negative for dysuria, frequency, hematuria,  Musculoskeletal: Negative for myalgias, back pain, joint swelling,  arthralgias and gait problem.  Skin: Negative for rash, color change, pallor and wound.  Neurological:. Negative for dizziness/light-headedness, tremors, seizures, syncope, facial asymmetry, speech difficulty, weakness, numbness, headaches and paresthesias.  Hematological: Negative for adenopathy. Does not bruise/bleed easily.  Psychiatric/Behavioral:  Negative for depression, no loss of interest in normal activity or change in sleep pattern.   Physical Exam: This is a pleasant, 42 year old, well-developed, well-nourished, Jillian Barnes female, in no obvious distress Vitals: Temperature 98.2 degrees, pulse 89, respirations 16, blood pressure 120, rate, he weighed 175 pounds`1 HEENT reveals a normocephalic, atraumatic skull, no scleral icterus, no oral lesions  Neck is supple without any cervical or supraclavicular adenopathy.  Lungs are clear to auscultation bilaterally. There are no wheezes, rales or rhonci Cardiac is regular rate and rhythm with a normal S1 and S2. There are no murmurs, rubs, or bruits.  Abdomen is soft with good bowel sounds, there is no palpable mass. There is no palpable hepatosplenomegaly. There is no palpable fluid wave.  Musculoskeletal no tenderness of the spine, ribs, or hips.  Extremities there are no clubbing, cyanosis, or edema.  Skin no petechia, purpura or ecchymosis Neurologic is nonfocal.  Laboratory Data: [place] count 6.1, hemoglobin 12.5, hematocrit 30.0, platelets 297,000  Current Outpatient Prescriptions on File Prior to Visit  Medication Sig Dispense Refill  . Cholecalciferol (VITAMIN D3) 2000 UNITS TABS Take 2,000 Units by mouth daily.      . metoprolol succinate (TOPROL-XL) 50 MG 24 hr tablet Take 25 mg by mouth Daily.      Marland Kitchen SYNTHROID 100 MCG tablet Take 10 mcg by mouth Daily.       No current facility-administered medications on file prior to visit.   Assessment/Plan: This is a pleasant, 42 year old, Jillian Barnes female, with the following  issues:  #1.   History of stage Ia nodular sclerosing Hodgkin disease.  She underwent 2 cycles of ABVD, followed by radiation.  She completed all of her therapy back in April, 2008.  There is no evidence of recurrent disease.  On today's exam.  There is no need for any imaging at this point.  Her risk of recurrence of less than 5%.  #2.  Fatigue.  She does have hypothyroidism.  She is on Synthroid.  She's not had her TSH checked in a while.  She will followup with her primary care physician.  #3.  Followup.  [name] will follow back up with [place] in one year, but before then should there be questions or concerns.

## 2012-11-29 LAB — COMPREHENSIVE METABOLIC PANEL
ALT: 12 U/L (ref 0–35)
AST: 12 U/L (ref 0–37)
Alkaline Phosphatase: 51 U/L (ref 39–117)
BUN: 11 mg/dL (ref 6–23)
Chloride: 103 mEq/L (ref 96–112)
Creatinine, Ser: 0.88 mg/dL (ref 0.50–1.10)
Total Bilirubin: 0.5 mg/dL (ref 0.3–1.2)

## 2012-11-29 LAB — LACTATE DEHYDROGENASE: LDH: 141 U/L (ref 94–250)

## 2013-03-25 ENCOUNTER — Other Ambulatory Visit: Payer: Self-pay | Admitting: Obstetrics and Gynecology

## 2013-03-25 DIAGNOSIS — R928 Other abnormal and inconclusive findings on diagnostic imaging of breast: Secondary | ICD-10-CM

## 2013-04-13 ENCOUNTER — Ambulatory Visit
Admission: RE | Admit: 2013-04-13 | Discharge: 2013-04-13 | Disposition: A | Discharge status: Home / Self Care | Payer: BC Managed Care – PPO | Source: Ambulatory Visit | Attending: Obstetrics and Gynecology | Admitting: Obstetrics and Gynecology

## 2013-04-13 DIAGNOSIS — R928 Other abnormal and inconclusive findings on diagnostic imaging of breast: Secondary | ICD-10-CM

## 2013-11-27 ENCOUNTER — Encounter: Payer: Self-pay | Admitting: Hematology & Oncology

## 2013-11-27 ENCOUNTER — Ambulatory Visit (HOSPITAL_BASED_OUTPATIENT_CLINIC_OR_DEPARTMENT_OTHER): Payer: BC Managed Care – PPO | Admitting: Hematology & Oncology

## 2013-11-27 ENCOUNTER — Other Ambulatory Visit (HOSPITAL_BASED_OUTPATIENT_CLINIC_OR_DEPARTMENT_OTHER): Payer: BC Managed Care – PPO | Admitting: Lab

## 2013-11-27 VITALS — BP 140/85 | HR 79 | Temp 97.9°F | Resp 14 | Ht 66.0 in | Wt 171.0 lb

## 2013-11-27 DIAGNOSIS — C8190 Hodgkin lymphoma, unspecified, unspecified site: Secondary | ICD-10-CM

## 2013-11-27 DIAGNOSIS — E559 Vitamin D deficiency, unspecified: Secondary | ICD-10-CM

## 2013-11-27 DIAGNOSIS — Z7982 Long term (current) use of aspirin: Secondary | ICD-10-CM

## 2013-11-27 DIAGNOSIS — Z8571 Personal history of Hodgkin lymphoma: Secondary | ICD-10-CM

## 2013-11-27 LAB — CBC WITH DIFFERENTIAL (CANCER CENTER ONLY)
BASO#: 0.1 10*3/uL (ref 0.0–0.2)
BASO%: 1.5 % (ref 0.0–2.0)
EOS%: 3.8 % (ref 0.0–7.0)
Eosinophils Absolute: 0.2 10*3/uL (ref 0.0–0.5)
HCT: 37.3 % (ref 34.8–46.6)
HGB: 12.3 g/dL (ref 11.6–15.9)
LYMPH#: 2.1 10*3/uL (ref 0.9–3.3)
LYMPH%: 40.4 % (ref 14.0–48.0)
MCH: 29.8 pg (ref 26.0–34.0)
MCHC: 33 g/dL (ref 32.0–36.0)
MCV: 90 fL (ref 81–101)
MONO#: 0.4 10*3/uL (ref 0.1–0.9)
MONO%: 8.2 % (ref 0.0–13.0)
NEUT#: 2.4 10*3/uL (ref 1.5–6.5)
NEUT%: 46.1 % (ref 39.6–80.0)
PLATELETS: 316 10*3/uL (ref 145–400)
RBC: 4.13 10*6/uL (ref 3.70–5.32)
RDW: 13.5 % (ref 11.1–15.7)
WBC: 5.3 10*3/uL (ref 3.9–10.0)

## 2013-11-28 LAB — LACTATE DEHYDROGENASE: LDH: 143 U/L (ref 94–250)

## 2013-11-28 LAB — COMPREHENSIVE METABOLIC PANEL
ALK PHOS: 59 U/L (ref 39–117)
ALT: 12 U/L (ref 0–35)
AST: 11 U/L (ref 0–37)
Albumin: 3.9 g/dL (ref 3.5–5.2)
BUN: 11 mg/dL (ref 6–23)
CO2: 27 mEq/L (ref 19–32)
Calcium: 9 mg/dL (ref 8.4–10.5)
Chloride: 102 mEq/L (ref 96–112)
Creatinine, Ser: 0.71 mg/dL (ref 0.50–1.10)
Glucose, Bld: 83 mg/dL (ref 70–99)
Potassium: 4 mEq/L (ref 3.5–5.3)
SODIUM: 137 meq/L (ref 135–145)
TOTAL PROTEIN: 6.8 g/dL (ref 6.0–8.3)
Total Bilirubin: 0.7 mg/dL (ref 0.2–1.2)

## 2013-11-28 LAB — VITAMIN D 25 HYDROXY (VIT D DEFICIENCY, FRACTURES): VIT D 25 HYDROXY: 55 ng/mL (ref 30–89)

## 2013-11-28 NOTE — Progress Notes (Signed)
Hematology and Oncology Follow Up Visit  Jillian Barnes [id number] [birth year] 43 y.o. 11/28/2013   Principle Diagnosis:  Stage IA nodular sclerosing Hodgkin disease.  Current Therapy:    Observation     Interim History:  Ms.  Barnes is back for her yearly followup. She did do well. She has not lost a lot of weight. She wants to try lose more weight. Pressure still working. She's having no problems with nausea vomiting. Is no bony pain. There is no increase in fatigue or weakness. She's had no cough or shortness of breath. There's been no issues with infectious. She's had no rashes. She's had no change in bowel or bladder habits.  She still has her monthly cycles.  Medications: Current outpatient prescriptions:Cholecalciferol (VITAMIN D3) 2000 UNITS TABS, Take 2,000 Units by mouth daily., Disp: , Rfl: ;  metoprolol succinate (TOPROL-XL) 50 MG 24 hr tablet, Take 25 mg by mouth Daily., Disp: , Rfl: ;  Nutritional Supplements (JUICE PLUS FIBRE PO), Take by mouth every morning., Disp: , Rfl: ;  SYNTHROID 100 MCG tablet, Take 10 mcg by mouth Daily., Disp: , Rfl:   Allergies:  Allergies  Allergen Reactions  . Sulfa Antibiotics Rash    Past Medical History, Surgical history, Social history, and Family History were reviewed and updated.  Review of Systems: As above  Physical Exam:  height is 5\' 6"  (1.676 m) and weight is 171 lb (77.565 kg). Her oral temperature is 97.9 F (36.6 C). Her blood pressure is 140/85 and her pulse is 79. Her respiration is 14.   Well-developed and well-nourished [place] female. Head and neck exam shows no adenopathy. No ocular or oral lesions noted. Lungs are clear. Cardiac exam regular in rhythm with no murmurs rubs or bruits. I sores as is no bilateral axillary adenopathy. Abdomen soft. Has good bowel sounds. There is no fluid wave. There is no palpable liver or spleen tip. Back exam no tenderness over the spine ribs or hips. Extremities shows no clubbing cyanosis or  edema. Neurological exam shows no focal neurological deficits. Skin exam no rashes.  Lab Results  Component Value Date   WBC 5.3 11/27/2013   HGB 12.3 11/27/2013   HCT 37.3 11/27/2013   MCV 90 11/27/2013   PLT 316 11/27/2013     Chemistry      Component Value Date/Time   NA 137 11/27/2013 1327   K 4.0 11/27/2013 1327   CL 102 11/27/2013 1327   CO2 27 11/27/2013 1327   BUN 11 11/27/2013 1327   CREATININE 0.71 11/27/2013 1327      Component Value Date/Time   CALCIUM 9.0 11/27/2013 1327   ALKPHOS 59 11/27/2013 1327   AST 11 11/27/2013 1327   ALT 12 11/27/2013 1327   BILITOT 0.7 11/27/2013 1327         Impression and Plan: Jillian Barnes is a 43 year old [place] female with a history of early stage Hodgkin's disease. In my mind, she's cured. I think has a recurrence is easily less than 5%.  She is now 7 years from treatment.  She, again,  would be a risk for a second malignancy. Again, I think this would be very unusual.  We will go ahead and plan for another yearly followup. She is getting her mammograms. She's taken vitamin D and baby aspirin. I think all these were incredibly important for her to maintain.  [name], MD 4/25/20157:15 AM

## 2014-06-04 ENCOUNTER — Other Ambulatory Visit: Payer: Self-pay | Admitting: Obstetrics and Gynecology

## 2014-06-07 LAB — CYTOLOGY - PAP

## 2014-11-26 ENCOUNTER — Encounter: Payer: Self-pay | Admitting: Family

## 2014-11-26 ENCOUNTER — Ambulatory Visit (HOSPITAL_BASED_OUTPATIENT_CLINIC_OR_DEPARTMENT_OTHER): Payer: BC Managed Care – PPO | Admitting: Family

## 2014-11-26 ENCOUNTER — Other Ambulatory Visit (HOSPITAL_BASED_OUTPATIENT_CLINIC_OR_DEPARTMENT_OTHER): Payer: BC Managed Care – PPO

## 2014-11-26 VITALS — BP 124/70 | HR 74 | Temp 98.3°F | Resp 14 | Ht 66.0 in | Wt 174.0 lb

## 2014-11-26 DIAGNOSIS — C8190 Hodgkin lymphoma, unspecified, unspecified site: Secondary | ICD-10-CM | POA: Diagnosis not present

## 2014-11-26 DIAGNOSIS — E559 Vitamin D deficiency, unspecified: Secondary | ICD-10-CM

## 2014-11-26 LAB — CBC WITH DIFFERENTIAL (CANCER CENTER ONLY)
BASO#: 0.1 10*3/uL (ref 0.0–0.2)
BASO%: 1 % (ref 0.0–2.0)
EOS ABS: 0.3 10*3/uL (ref 0.0–0.5)
EOS%: 4.1 % (ref 0.0–7.0)
HEMATOCRIT: 37.7 % (ref 34.8–46.6)
HGB: 12.4 g/dL (ref 11.6–15.9)
LYMPH#: 2.1 10*3/uL (ref 0.9–3.3)
LYMPH%: 31.4 % (ref 14.0–48.0)
MCH: 29.3 pg (ref 26.0–34.0)
MCHC: 32.9 g/dL (ref 32.0–36.0)
MCV: 89 fL (ref 81–101)
MONO#: 0.7 10*3/uL (ref 0.1–0.9)
MONO%: 9.6 % (ref 0.0–13.0)
NEUT#: 3.6 10*3/uL (ref 1.5–6.5)
NEUT%: 53.9 % (ref 39.6–80.0)
PLATELETS: 340 10*3/uL (ref 145–400)
RBC: 4.23 10*6/uL (ref 3.70–5.32)
RDW: 14 % (ref 11.1–15.7)
WBC: 6.8 10*3/uL (ref 3.9–10.0)

## 2014-11-26 LAB — COMPREHENSIVE METABOLIC PANEL
ALT: 12 U/L (ref 0–35)
AST: 12 U/L (ref 0–37)
Albumin: 4 g/dL (ref 3.5–5.2)
Alkaline Phosphatase: 60 U/L (ref 39–117)
BILIRUBIN TOTAL: 0.6 mg/dL (ref 0.2–1.2)
BUN: 15 mg/dL (ref 6–23)
CO2: 25 mEq/L (ref 19–32)
CREATININE: 0.76 mg/dL (ref 0.50–1.10)
Calcium: 8.8 mg/dL (ref 8.4–10.5)
Chloride: 103 mEq/L (ref 96–112)
GLUCOSE: 66 mg/dL — AB (ref 70–99)
Potassium: 4.1 mEq/L (ref 3.5–5.3)
SODIUM: 139 meq/L (ref 135–145)
TOTAL PROTEIN: 6.7 g/dL (ref 6.0–8.3)

## 2014-11-26 NOTE — Progress Notes (Signed)
Hematology and Oncology Follow Up Visit  FRITZIE PRIOLEAU [id number] [birth year] 44 y.o. 11/26/2014   Principle Diagnosis:  Stage IA nodular sclerosing Hodgkin disease  Current Therapy:   Observation    Interim History:  Ms. Zavada is here today with her husband for her yearly follow-up. She is doing well and has no complaints at this time.  No problem with infections. No fever, chills, n/v, cough, rash, dizziness, SOB, chest pain, palpitations, abdominal pain, constipation, diarrhea, blood in her urine or stool. No lymphadenopathy. No swelling, tenderness, numbness or tingling in her extremities. No new aches or pains.  She is still having her cycles.  Her appetite is good and she is staying hydrated. Her weight is stable.     Medications:    Medication List       This list is accurate as of: 11/26/14  4:17 PM.  Always use your most recent med list.               JUICE PLUS FIBRE PO  Take by mouth every morning.     metoprolol succinate 50 MG 24 hr tablet  Commonly known as:  TOPROL-XL  Take 100 mg by mouth Daily.     SYNTHROID 100 MCG tablet  Generic drug:  levothyroxine  Take 10 mcg by mouth Daily.     Vitamin D3 2000 UNITS Tabs  Take 2,000 Units by mouth daily.        Allergies:  Allergies  Allergen Reactions  . Sulfa Antibiotics Rash    Past Medical History, Surgical history, Social history, and Family History were reviewed and updated.  Review of Systems: All other 10 point review of systems is negative.   Physical Exam:  height is 5\' 6"  (1.676 m) and weight is 174 lb (78.926 kg). Her oral temperature is 98.3 F (36.8 C). Her blood pressure is 124/70 and her pulse is 74. Her respiration is 14.   Wt Readings from Last 3 Encounters:  11/26/14 174 lb (78.926 kg)  11/27/13 171 lb (77.565 kg)  11/28/12 175 lb (79.379 kg)    Ocular: Sclerae unicteric, pupils equal, round and reactive to light Ear-nose-throat: Oropharynx clear, dentition fair Lymphatic:  No cervical or supraclavicular adenopathy Lungs no rales or rhonchi, good excursion bilaterally Heart regular rate and rhythm, no murmur appreciated Abd soft, nontender, positive bowel sounds MSK no focal spinal tenderness, no joint edema Neuro: non-focal, well-oriented, appropriate affect Breasts: No changes. No mass, lesions or rash. No lymphadenopathy.   Lab Results  Component Value Date   WBC 6.8 11/26/2014   HGB 12.4 11/26/2014   HCT 37.7 11/26/2014   MCV 89 11/26/2014   PLT 340 11/26/2014   No results found for: FERRITIN, IRON, TIBC, UIBC, IRONPCTSAT Lab Results  Component Value Date   RBC 4.23 11/26/2014   No results found for: KPAFRELGTCHN, LAMBDASER, KAPLAMBRATIO No results found for: IGGSERUM, IGA, IGMSERUM No results found for: Ronnald Ramp, A1GS, A2GS, Tillman Sers, SPEI   Chemistry      Component Value Date/Time   NA 137 11/27/2013 1327   K 4.0 11/27/2013 1327   CL 102 11/27/2013 1327   CO2 27 11/27/2013 1327   BUN 11 11/27/2013 1327   CREATININE 0.71 11/27/2013 1327      Component Value Date/Time   CALCIUM 9.0 11/27/2013 1327   ALKPHOS 59 11/27/2013 1327   AST 11 11/27/2013 1327   ALT 12 11/27/2013 1327   BILITOT 0.7 11/27/2013 1327     Impression  and Plan: Ms. Marchiano is a 44 year old [place] female with a history of early stage Hodgkin's disease. She is doing well and is asymptomatic at this time. There has been no evidence of recurrence. She is now 8 years out from treatment.  She is taking her vitamin D daily.  She states that she will be due again for a mammogram in the fall and that her last one was negative.  We will see her again in 1 year for labs and follow-up.   Eliezer Bottom, NP 4/22/20164:17 PM

## 2014-11-27 LAB — VITAMIN D 25 HYDROXY (VIT D DEFICIENCY, FRACTURES): VIT D 25 HYDROXY: 32 ng/mL (ref 30–100)

## 2015-07-26 ENCOUNTER — Other Ambulatory Visit: Payer: Self-pay | Admitting: Obstetrics and Gynecology

## 2015-07-26 DIAGNOSIS — N644 Mastodynia: Secondary | ICD-10-CM

## 2015-07-29 ENCOUNTER — Ambulatory Visit
Admission: RE | Admit: 2015-07-29 | Discharge: 2015-07-29 | Disposition: A | Discharge status: Home / Self Care | Payer: BC Managed Care – PPO | Source: Ambulatory Visit | Attending: Obstetrics and Gynecology | Admitting: Obstetrics and Gynecology

## 2015-07-29 DIAGNOSIS — N644 Mastodynia: Secondary | ICD-10-CM

## 2015-11-25 ENCOUNTER — Ambulatory Visit (HOSPITAL_BASED_OUTPATIENT_CLINIC_OR_DEPARTMENT_OTHER): Payer: BC Managed Care – PPO | Admitting: Hematology & Oncology

## 2015-11-25 ENCOUNTER — Other Ambulatory Visit (HOSPITAL_BASED_OUTPATIENT_CLINIC_OR_DEPARTMENT_OTHER): Payer: BC Managed Care – PPO

## 2015-11-25 VITALS — BP 141/93 | HR 75 | Temp 97.8°F | Wt 177.0 lb

## 2015-11-25 DIAGNOSIS — L309 Dermatitis, unspecified: Secondary | ICD-10-CM

## 2015-11-25 DIAGNOSIS — Z8579 Personal history of other malignant neoplasms of lymphoid, hematopoietic and related tissues: Secondary | ICD-10-CM | POA: Diagnosis not present

## 2015-11-25 DIAGNOSIS — L272 Dermatitis due to ingested food: Secondary | ICD-10-CM

## 2015-11-25 DIAGNOSIS — C8190 Hodgkin lymphoma, unspecified, unspecified site: Secondary | ICD-10-CM

## 2015-11-25 LAB — CBC WITH DIFFERENTIAL (CANCER CENTER ONLY)
BASO#: 0.1 10*3/uL (ref 0.0–0.2)
BASO%: 1 % (ref 0.0–2.0)
EOS ABS: 0.3 10*3/uL (ref 0.0–0.5)
EOS%: 4.7 % (ref 0.0–7.0)
HEMATOCRIT: 36.3 % (ref 34.8–46.6)
HGB: 12.1 g/dL (ref 11.6–15.9)
LYMPH#: 1.9 10*3/uL (ref 0.9–3.3)
LYMPH%: 31.4 % (ref 14.0–48.0)
MCH: 29 pg (ref 26.0–34.0)
MCHC: 33.3 g/dL (ref 32.0–36.0)
MCV: 87 fL (ref 81–101)
MONO#: 0.5 10*3/uL (ref 0.1–0.9)
MONO%: 7.4 % (ref 0.0–13.0)
NEUT%: 55.5 % (ref 39.6–80.0)
NEUTROS ABS: 3.4 10*3/uL (ref 1.5–6.5)
Platelets: 281 10*3/uL (ref 145–400)
RBC: 4.17 10*6/uL (ref 3.70–5.32)
RDW: 14.4 % (ref 11.1–15.7)
WBC: 6.2 10*3/uL (ref 3.9–10.0)

## 2015-11-25 LAB — COMPREHENSIVE METABOLIC PANEL
ALK PHOS: 62 U/L (ref 40–150)
ALT: 12 U/L (ref 0–55)
ANION GAP: 8 meq/L (ref 3–11)
AST: 13 U/L (ref 5–34)
Albumin: 3.7 g/dL (ref 3.5–5.0)
BUN: 12 mg/dL (ref 7.0–26.0)
CALCIUM: 9.1 mg/dL (ref 8.4–10.4)
CO2: 25 mEq/L (ref 22–29)
Chloride: 105 mEq/L (ref 98–109)
Creatinine: 0.8 mg/dL (ref 0.6–1.1)
EGFR: 89 mL/min/{1.73_m2} — AB (ref >90)
GLUCOSE: 81 mg/dL (ref 70–140)
POTASSIUM: 3.6 meq/L (ref 3.5–5.1)
Sodium: 139 mEq/L (ref 136–145)
Total Bilirubin: 0.71 mg/dL (ref 0.20–1.20)
Total Protein: 7 g/dL (ref 6.4–8.3)

## 2015-11-25 MED ORDER — FAMOTIDINE 40 MG PO TABS: 40.0000 mg | ORAL_TABLET | Freq: Two times a day (BID) | ORAL | Status: DC

## 2015-11-25 MED ORDER — METHYLPREDNISOLONE 4 MG PO TBPK: ORAL_TABLET | ORAL | Status: DC

## 2015-11-26 LAB — VITAMIN D 25 HYDROXY (VIT D DEFICIENCY, FRACTURES): VIT D 25 HYDROXY: 37.9 ng/mL (ref 30.0–100.0)

## 2015-11-27 NOTE — Progress Notes (Signed)
Hematology and Oncology Follow Up Visit  BRIYANNA GRINNAN HR:[id number] 1971/03/20 45 y.o. 11/27/2015   Principle Diagnosis:  Stage IA nodular sclerosing Hodgkin disease.  Current Therapy:    Observation     Interim History:  Ms.  Cheshire is back for her yearly followup. Maculopapular type rash. It itches. It is not vesicular. She has tried some steroid cream. This is not helped all that much. She has not traveled recently. She has had no fever. There's not been any bleeding. She still has her monthly cycles. She does not recall eating a unusual.  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  The now has been I think 10 years since her Hodgkin's disease has been treated. She's done incredibly well with this.  She does get her mammograms.  She's had no cough. She's had no joint swelling.  Overall, her performance status is ECOG 0.    Medications:  Current outpatient prescriptions:  .  Cholecalciferol (VITAMIN D3) 2000 UNITS TABS, Take 2,000 Units by mouth daily., Disp: , Rfl:  .  clobetasol cream (TEMOVATE) 0.05 %, Apply [id number] application topically every morning., Disp: , Rfl: 2 .  metoprolol succinate (TOPROL-XL) 50 MG 24 hr tablet, Take 100 mg by mouth Daily. , Disp: , Rfl:  .  Nutritional Supplements (JUICE PLUS FIBRE PO), Take by mouth every morning., Disp: , Rfl:  .  SYNTHROID 100 MCG tablet, Take 10 mcg by mouth Daily., Disp: , Rfl:  .  famotidine (PEPCID) 40 MG tablet, Take 1 tablet (40 mg total) by mouth 2 (two) times daily., Disp: 14 tablet, Rfl: 0 .  methylPREDNISolone (MEDROL DOSEPAK) 4 MG TBPK tablet, Take as directed., Disp: 21 tablet, Rfl: 0  Allergies:  Allergies  Allergen Reactions  . Sulfa Antibiotics Rash    Past Medical History, Surgical history, Social history, and Family History were reviewed and updated.  Review of Systems: As above  Physical Exam:  weight is 177 lb (80.287 kg). Her oral temperature is 97.8 F (36.6 C). Her blood pressure is 141/93  and her pulse is 75.   Well-developed and well-nourished [place] female. Head and neck exam shows no adenopathy. No ocular or oral lesions noted. Lungs are clear. Cardiac exam regular in rhythm with no murmurs rubs or bruits. I sores as is no bilateral axillary adenopathy. Abdomen soft. Has good bowel sounds. There is no fluid wave. There is no palpable liver or spleen tip. Back exam no tenderness over the spine ribs or hips. Extremities shows no clubbing cyanosis or edema. Neurological exam shows no focal neurological deficits. Skin exam distinct areas of a rash. These are not coalescent. The rash is more papular. There is no vesicle. There is some blanching.  Lab Results  Component Value Date   WBC 6.2 11/25/2015   HGB 12.1 11/25/2015   HCT 36.3 11/25/2015   MCV 87 11/25/2015   PLT 281 11/25/2015     Chemistry      Component Value Date/Time   NA 139 11/25/2015 1346   NA 139 11/26/2014 1428   K 3.6 11/25/2015 1346   K 4.1 11/26/2014 1428   CL 103 11/26/2014 1428   CO2 25 11/25/2015 1346   CO2 25 11/26/2014 1428   BUN 12.0 11/25/2015 1346   BUN 15 11/26/2014 1428   CREATININE 0.8 11/25/2015 1346   CREATININE 0.76 11/26/2014 1428      Component Value Date/Time   CALCIUM 9.1 11/25/2015 1346   CALCIUM 8.8 11/26/2014 1428  ALKPHOS 62 11/25/2015 1346   ALKPHOS 60 11/26/2014 1428   AST 13 11/25/2015 1346   AST 12 11/26/2014 1428   ALT 12 11/25/2015 1346   ALT 12 11/26/2014 1428   BILITOT 0.71 11/25/2015 1346   BILITOT 0.6 11/26/2014 1428         Impression and Plan: Ms. Merwin is a 45 year old [place] female with a history of early stage Hodgkin's disease. In my mind, she's cured. I think has a recurrence is easily less than 5%.  She is now 10 years from treatment.  I'm not sure as to what this rash is. I do not think it is anything related to her having the Hodgkin's disease or having the Hodgkin's disease come back.  It looks more like some type of dermatitis. I know that  she has seen a dermatologist.  Maybe a Medrol Dosepak and Pepcid will help with this. If not, then I think her dermatologist probably needs to do a biopsy of one of the areas.  I really believe that she is cured.  We will plan to get her back in 1 more year.  Her vitamin D level is fantastic. [name], MD 4/23/20179:51 AM

## 2015-12-12 ENCOUNTER — Other Ambulatory Visit: Payer: Self-pay | Admitting: Family Medicine

## 2015-12-12 DIAGNOSIS — R1011 Right upper quadrant pain: Secondary | ICD-10-CM

## 2015-12-15 ENCOUNTER — Ambulatory Visit
Admission: RE | Admit: 2015-12-15 | Discharge: 2015-12-15 | Disposition: A | Discharge status: Home / Self Care | Payer: BC Managed Care – PPO | Source: Ambulatory Visit | Attending: Family Medicine | Admitting: Family Medicine

## 2015-12-15 DIAGNOSIS — R1011 Right upper quadrant pain: Secondary | ICD-10-CM

## 2016-03-06 ENCOUNTER — Encounter: Payer: Self-pay | Admitting: Nurse Practitioner

## 2016-03-06 NOTE — Progress Notes (Signed)
Patient called stating she had taken the medrol dose pak from the past visit and now she was having some skin irritation and itching again. Pt was instructed per Dr. Marin Olp and office notes to contact her current dermatologist for evaluation and possible treatment per their recommendation. Pt verbalized understanding and appreciation and stated she would contact them for an appointment.

## 2016-11-26 ENCOUNTER — Other Ambulatory Visit (HOSPITAL_BASED_OUTPATIENT_CLINIC_OR_DEPARTMENT_OTHER): Payer: BC Managed Care – PPO

## 2016-11-26 ENCOUNTER — Ambulatory Visit: Payer: BC Managed Care – PPO

## 2016-11-26 ENCOUNTER — Other Ambulatory Visit: Payer: Self-pay | Admitting: *Deleted

## 2016-11-26 ENCOUNTER — Ambulatory Visit (HOSPITAL_BASED_OUTPATIENT_CLINIC_OR_DEPARTMENT_OTHER): Payer: BC Managed Care – PPO | Admitting: Hematology & Oncology

## 2016-11-26 VITALS — BP 127/79 | HR 72 | Temp 98.5°F | Resp 18 | Wt 181.1 lb

## 2016-11-26 DIAGNOSIS — C8114 Nodular sclerosis classical Hodgkin lymphoma, lymph nodes of axilla and upper limb: Secondary | ICD-10-CM

## 2016-11-26 DIAGNOSIS — E039 Hypothyroidism, unspecified: Secondary | ICD-10-CM

## 2016-11-26 DIAGNOSIS — C8190 Hodgkin lymphoma, unspecified, unspecified site: Secondary | ICD-10-CM

## 2016-11-26 DIAGNOSIS — Z8579 Personal history of other malignant neoplasms of lymphoid, hematopoietic and related tissues: Secondary | ICD-10-CM | POA: Diagnosis not present

## 2016-11-26 DIAGNOSIS — D500 Iron deficiency anemia secondary to blood loss (chronic): Secondary | ICD-10-CM | POA: Diagnosis not present

## 2016-11-26 LAB — COMPREHENSIVE METABOLIC PANEL (CC13)
ALBUMIN: 3.8 g/dL (ref 3.5–5.5)
ALK PHOS: 66 IU/L (ref 39–117)
ALT: 15 IU/L (ref 0–32)
AST (SGOT): 13 IU/L (ref 0–40)
Albumin/Globulin Ratio: 1.5 (ref 1.2–2.2)
BUN / CREAT RATIO: 15 (ref 9–23)
BUN: 11 mg/dL (ref 6–24)
Bilirubin Total: 0.4 mg/dL (ref 0.0–1.2)
CO2: 26 mmol/L (ref 18–29)
CREATININE: 0.71 mg/dL (ref 0.57–1.00)
Calcium, Ser: 8.3 mg/dL — ABNORMAL LOW (ref 8.7–10.2)
Chloride, Ser: 103 mmol/L (ref 96–106)
GFR, EST AFRICAN AMERICAN: 119 mL/min/{1.73_m2} (ref >59)
GFR, EST NON AFRICAN AMERICAN: 103 mL/min/{1.73_m2} (ref >59)
GLOBULIN, TOTAL: 2.6 g/dL (ref 1.5–4.5)
Glucose: 87 mg/dL (ref 65–99)
Potassium, Ser: 3.9 mmol/L (ref 3.5–5.2)
SODIUM: 133 mmol/L — AB (ref 134–144)
TOTAL PROTEIN: 6.4 g/dL (ref 6.0–8.5)

## 2016-11-26 LAB — CBC WITH DIFFERENTIAL (CANCER CENTER ONLY)
BASO#: 0.1 10*3/uL (ref 0.0–0.2)
BASO%: 1.3 % (ref 0.0–2.0)
EOS%: 5.6 % (ref 0.0–7.0)
Eosinophils Absolute: 0.3 10*3/uL (ref 0.0–0.5)
HEMATOCRIT: 33.7 % — AB (ref 34.8–46.6)
HEMOGLOBIN: 10.9 g/dL — AB (ref 11.6–15.9)
LYMPH#: 2.1 10*3/uL (ref 0.9–3.3)
LYMPH%: 40.3 % (ref 14.0–48.0)
MCH: 27.9 pg (ref 26.0–34.0)
MCHC: 32.3 g/dL (ref 32.0–36.0)
MCV: 86 fL (ref 81–101)
MONO#: 0.4 10*3/uL (ref 0.1–0.9)
MONO%: 8.4 % (ref 0.0–13.0)
NEUT%: 44.4 % (ref 39.6–80.0)
NEUTROS ABS: 2.3 10*3/uL (ref 1.5–6.5)
Platelets: 295 10*3/uL (ref 145–400)
RBC: 3.91 10*6/uL (ref 3.70–5.32)
RDW: 14.5 % (ref 11.1–15.7)
WBC: 5.2 10*3/uL (ref 3.9–10.0)

## 2016-11-26 NOTE — Progress Notes (Signed)
Hematology and Oncology Follow Up Visit  Jillian Barnes [id number] [birth year] 46 y.o. 11/26/2016   Principle Diagnosis:  Stage IA nodular sclerosing Hodgkin disease.  Current Therapy:    Observation     Interim History:  Ms.  Barnes is back for her yearly followup. -She is doing okay. She really has no specific complaint Tamela Oddi just being tired. I saw that she is somewhat anemic. I have to believe that given her monthly cycles, that she probably has a little bit of iron deficiency.   She also has some hypothyroidism. Her weight is going up. I just wonder if her TSH might be on the high side.  She does have a new job. 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  She's had no fever. She's had no rashes. Last time she was here, she had a rash.  She's had no change in bowel or bladder habits.  She and her family were going up to [place] I think this summer for vacation.  Overall, her performance status is ECOG 0.    Medications:  Current Outpatient Prescriptions:  .  Cholecalciferol (VITAMIN D3) 2000 UNITS TABS, Take 2,000 Units by mouth daily., Disp: , Rfl:  .  famotidine (PEPCID) 40 MG tablet, Take 1 tablet (40 mg total) by mouth 2 (two) times daily., Disp: 14 tablet, Rfl: 0 .  metoprolol succinate (TOPROL-XL) 50 MG 24 hr tablet, Take 100 mg by mouth Daily. , Disp: , Rfl:  .  Nutritional Supplements (JUICE PLUS FIBRE PO), Take by mouth every morning., Disp: , Rfl:  .  SYNTHROID 100 MCG tablet, Take 10 mcg by mouth Daily., Disp: , Rfl:   Allergies:  Allergies  Allergen Reactions  . Sulfa Antibiotics Rash    Past Medical History, Surgical history, Social history, and Family History were reviewed and updated.  Review of Systems: As above  Physical Exam:  weight is 181 lb 1.9 oz (82.2 kg). Her oral temperature is 98.5 F (36.9 C). Her blood pressure is 127/79 and her pulse is 72. Her respiration is 18 and oxygen saturation is 100%.   Well-developed and well-nourished  Jillian Barnes. Head and neck exam shows no adenopathy. No ocular or oral lesions noted. Lungs are clear. Cardiac exam regular in rhythm with no murmurs rubs or bruits. I sores as is no bilateral axillary adenopathy. Abdomen soft. Has good bowel sounds. There is no fluid wave. There is no palpable liver or spleen tip. Back exam no tenderness over the spine ribs or hips. Extremities shows no clubbing cyanosis or edema. Neurological exam shows no focal neurological deficits. Skin exam shows no rashes, ecchymoses or petechia.  Lab Results  Component Value Date   WBC 5.2 11/26/2016   HGB 10.9 (L) 11/26/2016   HCT 33.7 (L) 11/26/2016   MCV 86 11/26/2016   PLT 295 11/26/2016     Chemistry      Component Value Date/Time   NA 139 11/25/2015 1346   K 3.6 11/25/2015 1346   CL 103 11/26/2014 1428   CO2 25 11/25/2015 1346   BUN 12.0 11/25/2015 1346   CREATININE 0.8 11/25/2015 1346      Component Value Date/Time   CALCIUM 9.1 11/25/2015 1346   ALKPHOS 62 11/25/2015 1346   AST 13 11/25/2015 1346   ALT 12 11/25/2015 1346   BILITOT 0.71 11/25/2015 1346         Impression and Plan: Jillian Barnes is a 46 year old Jillian Barnes with a history of early stage  Hodgkin's disease. In my mind, she's cured. I think has a recurrence is easily less than 5%.  She is now 11 years from treatment.  We will check her iron studies. Again I have to believe that they will be on the low side.  We will plan to get her back in 1 more year.   [name], MD 4/23/20182:12 PM

## 2016-11-27 ENCOUNTER — Telehealth: Payer: Self-pay | Admitting: *Deleted

## 2016-11-27 LAB — IRON AND TIBC
%SAT: 6 % — ABNORMAL LOW (ref 21–57)
IRON: 28 ug/dL — AB (ref 41–142)
TIBC: 449 ug/dL — AB (ref 236–444)
UIBC: 421 ug/dL — ABNORMAL HIGH (ref 120–384)

## 2016-11-27 LAB — FERRITIN: FERRITIN: 7 ng/mL — AB (ref 9–269)

## 2016-11-27 LAB — VITAMIN D 25 HYDROXY (VIT D DEFICIENCY, FRACTURES): VIT D 25 HYDROXY: 42.3 ng/mL (ref 30.0–100.0)

## 2016-11-27 NOTE — Addendum Note (Signed)
Addended by: Burney Gauze R on: 11/27/2016 01:20 PM   Modules accepted: Orders

## 2016-11-27 NOTE — Telephone Encounter (Addendum)
Patient is aware of results.   ----- Message from [name], MD sent at 11/27/2016 10:12 AM EDT ----- Call - vit D level is better!!  Jillian Barnes

## 2017-01-10 ENCOUNTER — Ambulatory Visit (HOSPITAL_BASED_OUTPATIENT_CLINIC_OR_DEPARTMENT_OTHER): Payer: BC Managed Care – PPO | Admitting: Hematology & Oncology

## 2017-01-10 ENCOUNTER — Other Ambulatory Visit (HOSPITAL_BASED_OUTPATIENT_CLINIC_OR_DEPARTMENT_OTHER): Payer: BC Managed Care – PPO

## 2017-01-10 VITALS — BP 117/69 | HR 81 | Temp 98.3°F | Resp 16 | Wt 179.0 lb

## 2017-01-10 DIAGNOSIS — N921 Excessive and frequent menstruation with irregular cycle: Secondary | ICD-10-CM | POA: Insufficient documentation

## 2017-01-10 DIAGNOSIS — C8114 Nodular sclerosis classical Hodgkin lymphoma, lymph nodes of axilla and upper limb: Secondary | ICD-10-CM | POA: Diagnosis not present

## 2017-01-10 DIAGNOSIS — E039 Hypothyroidism, unspecified: Secondary | ICD-10-CM

## 2017-01-10 DIAGNOSIS — Z8579 Personal history of other malignant neoplasms of lymphoid, hematopoietic and related tissues: Secondary | ICD-10-CM | POA: Diagnosis not present

## 2017-01-10 DIAGNOSIS — D500 Iron deficiency anemia secondary to blood loss (chronic): Secondary | ICD-10-CM | POA: Diagnosis not present

## 2017-01-10 DIAGNOSIS — C8101 Nodular lymphocyte predominant Hodgkin lymphoma, lymph nodes of head, face, and neck: Secondary | ICD-10-CM

## 2017-01-10 LAB — CBC WITH DIFFERENTIAL (CANCER CENTER ONLY)
BASO#: 0.1 10*3/uL (ref 0.0–0.2)
BASO%: 0.9 % (ref 0.0–2.0)
EOS%: 5 % (ref 0.0–7.0)
Eosinophils Absolute: 0.3 10*3/uL (ref 0.0–0.5)
HCT: 40.5 % (ref 34.8–46.6)
HGB: 13.3 g/dL (ref 11.6–15.9)
LYMPH#: 2.2 10*3/uL (ref 0.9–3.3)
LYMPH%: 34.6 % (ref 14.0–48.0)
MCH: 29 pg (ref 26.0–34.0)
MCHC: 32.8 g/dL (ref 32.0–36.0)
MCV: 88 fL (ref 81–101)
MONO#: 0.4 10*3/uL (ref 0.1–0.9)
MONO%: 6.8 % (ref 0.0–13.0)
NEUT#: 3.3 10*3/uL (ref 1.5–6.5)
NEUT%: 52.7 % (ref 39.6–80.0)
PLATELETS: 293 10*3/uL (ref 145–400)
RBC: 4.58 10*6/uL (ref 3.70–5.32)
RDW: 15.8 % — AB (ref 11.1–15.7)
WBC: 6.4 10*3/uL (ref 3.9–10.0)

## 2017-01-10 LAB — COMPREHENSIVE METABOLIC PANEL
ALT: 15 U/L (ref 0–55)
AST: 13 U/L (ref 5–34)
Albumin: 3.8 g/dL (ref 3.5–5.0)
Alkaline Phosphatase: 64 U/L (ref 40–150)
Anion Gap: 7 mEq/L (ref 3–11)
BUN: 12.4 mg/dL (ref 7.0–26.0)
CHLORIDE: 105 meq/L (ref 98–109)
CO2: 24 meq/L (ref 22–29)
CREATININE: 0.8 mg/dL (ref 0.6–1.1)
Calcium: 9.6 mg/dL (ref 8.4–10.4)
EGFR: 88 mL/min/{1.73_m2} — ABNORMAL LOW (ref >90)
Glucose: 89 mg/dl (ref 70–140)
Potassium: 3.9 mEq/L (ref 3.5–5.1)
Sodium: 137 mEq/L (ref 136–145)
Total Bilirubin: 0.75 mg/dL (ref 0.20–1.20)
Total Protein: 7 g/dL (ref 6.4–8.3)

## 2017-01-10 NOTE — Progress Notes (Signed)
Hematology and Oncology Follow Up Visit  Jillian Barnes [id number] Aug 22, 1970 46 y.o. 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  Barnes is back for her follow-up. We last saw her, she was complaining of some fatigue and weakness. She was anemic. We did go ahead and do iron studies on her. She is quite iron deficient. Her ferritin was 7 with iron saturation of 6%. She is on oral iron. She takes oral iron twice a day. She is feeling better. She still has some bouts of dizziness.  She and her family were up in [place] over [place] weekend. They had a good time. Again, she had a little bit of dizziness.  She does have menometrorrhagia. She is getting close to the menopause.  She has noted on occasion some blood per him. She may have some hemorrhoids. However, I think we have to see about a colonoscopy. I think this would be very reasonable.  She's had no problems with chewing ice area and she's had no fever. She's had no rashes. She's had no leg swelling.   Overall, her performance status is ECOG 0.    Medications:  Current Outpatient Prescriptions:  .  Cholecalciferol (VITAMIN D3) 2000 UNITS TABS, Take 2,000 Units by mouth daily., Disp: , Rfl:  .  metoprolol succinate (TOPROL-XL) 50 MG 24 hr tablet, Take 100 mg by mouth Daily. , Disp: , Rfl:  .  Nutritional Supplements (JUICE PLUS FIBRE PO), Take by mouth every morning., Disp: , Rfl:  .  SYNTHROID 100 MCG tablet, Take 10 mcg by mouth Daily., Disp: , Rfl:   Allergies:  Allergies  Allergen Reactions  . Sulfa Antibiotics Rash    Past Medical History, Surgical history, Social history, and Family History were reviewed and updated.  Review of Systems: As above  Physical Exam:  weight is 179 lb (81.2 kg). Her oral temperature is 98.3 F (36.8 C). Her blood  pressure is 117/69 and her pulse is 81. Her respiration is 16 and oxygen saturation is 100%.   Well-developed and well-nourished [place] female. Head and neck exam shows no adenopathy. No ocular or oral lesions noted. Lungs are clear. Cardiac exam regular in rhythm with no murmurs rubs or bruits. I sores as is no bilateral axillary adenopathy. Abdomen soft. Has good bowel sounds. There is no fluid wave. There is no palpable liver or spleen tip. Back exam no tenderness over the spine ribs or hips. Extremities shows no clubbing cyanosis or edema. Neurological exam shows no focal neurological deficits. Skin exam shows no rashes, ecchymoses or petechia.  Lab Results  Component Value Date   WBC 6.4 01/10/2017   HGB 13.3 01/10/2017   HCT 40.5 01/10/2017   MCV 88 01/10/2017   PLT 293 01/10/2017     Chemistry      Component Value Date/Time   NA 133 (L) 11/26/2016 1328   NA 139 11/25/2015 1346   K 3.9 11/26/2016 1328   K 3.6 11/25/2015 1346   CL 103 11/26/2016 1328   CO2 26 11/26/2016 1328   CO2 25 11/25/2015 1346   BUN 11 11/26/2016 1328   BUN 12.0 11/25/2015 1346   CREATININE 0.71 11/26/2016 1328   CREATININE 0.8 11/25/2015 1346      Component Value Date/Time   CALCIUM 8.3 (L) 11/26/2016 1328   CALCIUM 9.1  11/25/2015 1346   ALKPHOS 66 11/26/2016 1328   ALKPHOS 62 11/25/2015 1346   AST 13 11/26/2016 1328   AST 13 11/25/2015 1346   ALT 15 11/26/2016 1328   ALT 12 11/25/2015 1346   BILITOT 0.4 11/26/2016 1328   BILITOT 0.71 11/25/2015 1346         Impression and Plan: [name] is a 46 year old Jillian Barnes female with a history of early stage Hodgkin's disease. In my mind, she's cured. I think has a recurrence is easily less than 5%.  She is now 11 years from treatment.  We will clearly had to follow her iron studies. I would think that her iron studies should be better since she is much better with her anemia.  I do think that she does need to have a colonoscopy. The new  recommendations is 46 years old now. I think this would be very helpful. I talked to she and her husband about this. They agree. I will make the referral.  I will plan to see her back in a year. She is on oral iron. She's taking 1 pill a day. Jillian Napoleon, MD 6/7/20181:02 PM

## 2017-01-11 ENCOUNTER — Telehealth: Payer: Self-pay | Admitting: *Deleted

## 2017-01-11 LAB — IRON AND TIBC
%SAT: 49 % (ref 21–57)
IRON: 179 ug/dL — AB (ref 41–142)
TIBC: 364 ug/dL (ref 236–444)
UIBC: 185 ug/dL (ref 120–384)

## 2017-01-11 LAB — TSH: TSH: 0.832 m(IU)/L (ref 0.308–3.960)

## 2017-01-11 LAB — RETICULOCYTES: Reticulocyte Count: 1.2 % (ref 0.6–2.6)

## 2017-01-11 LAB — FERRITIN: FERRITIN: 40 ng/mL (ref 9–269)

## 2017-01-11 NOTE — Telephone Encounter (Addendum)
Patient is aware of results  ----- Message from [name], MD sent at 01/11/2017 11:43 AM EDT ----- Call - iron level is perfect!!!!  pete

## 2017-03-04 HISTORY — PX: COLONOSCOPY: SHX174

## 2017-11-25 ENCOUNTER — Inpatient Hospital Stay: Payer: BC Managed Care – PPO | Attending: Hematology & Oncology

## 2017-11-25 ENCOUNTER — Encounter: Payer: Self-pay | Admitting: Hematology & Oncology

## 2017-11-25 ENCOUNTER — Inpatient Hospital Stay (HOSPITAL_BASED_OUTPATIENT_CLINIC_OR_DEPARTMENT_OTHER): Payer: BC Managed Care – PPO | Admitting: Hematology & Oncology

## 2017-11-25 ENCOUNTER — Other Ambulatory Visit: Payer: Self-pay

## 2017-11-25 VITALS — BP 137/84 | HR 71 | Temp 98.5°F | Resp 16 | Wt 179.0 lb

## 2017-11-25 DIAGNOSIS — Z79899 Other long term (current) drug therapy: Secondary | ICD-10-CM | POA: Insufficient documentation

## 2017-11-25 DIAGNOSIS — C8100 Nodular lymphocyte predominant Hodgkin lymphoma, unspecified site: Secondary | ICD-10-CM

## 2017-11-25 DIAGNOSIS — M818 Other osteoporosis without current pathological fracture: Secondary | ICD-10-CM

## 2017-11-25 DIAGNOSIS — Z8579 Personal history of other malignant neoplasms of lymphoid, hematopoietic and related tissues: Secondary | ICD-10-CM | POA: Insufficient documentation

## 2017-11-25 DIAGNOSIS — D500 Iron deficiency anemia secondary to blood loss (chronic): Secondary | ICD-10-CM | POA: Insufficient documentation

## 2017-11-25 DIAGNOSIS — E039 Hypothyroidism, unspecified: Secondary | ICD-10-CM

## 2017-11-25 DIAGNOSIS — C8101 Nodular lymphocyte predominant Hodgkin lymphoma, lymph nodes of head, face, and neck: Secondary | ICD-10-CM

## 2017-11-25 DIAGNOSIS — N921 Excessive and frequent menstruation with irregular cycle: Secondary | ICD-10-CM

## 2017-11-25 LAB — CBC WITH DIFFERENTIAL (CANCER CENTER ONLY)
BASOS PCT: 1 %
Basophils Absolute: 0 10*3/uL (ref 0.0–0.1)
Eosinophils Absolute: 0.4 10*3/uL (ref 0.0–0.5)
Eosinophils Relative: 7 %
HEMATOCRIT: 38.7 % (ref 34.8–46.6)
HEMOGLOBIN: 13.2 g/dL (ref 11.6–15.9)
Lymphocytes Relative: 38 %
Lymphs Abs: 2.2 10*3/uL (ref 0.9–3.3)
MCH: 30.7 pg (ref 26.0–34.0)
MCHC: 34.1 g/dL (ref 32.0–36.0)
MCV: 90 fL (ref 81.0–101.0)
Monocytes Absolute: 0.4 10*3/uL (ref 0.1–0.9)
Monocytes Relative: 7 %
NEUTROS ABS: 2.7 10*3/uL (ref 1.5–6.5)
NEUTROS PCT: 47 %
Platelet Count: 289 10*3/uL (ref 145–400)
RBC: 4.3 MIL/uL (ref 3.70–5.32)
RDW: 13.1 % (ref 11.1–15.7)
WBC Count: 5.7 10*3/uL (ref 3.9–10.0)

## 2017-11-25 LAB — CMP (CANCER CENTER ONLY)
ALK PHOS: 60 U/L (ref 40–150)
ALT: 15 U/L (ref 0–55)
ANION GAP: 11 (ref 3–11)
AST: 14 U/L (ref 5–34)
Albumin: 4 g/dL (ref 3.5–5.0)
BUN: 10 mg/dL (ref 7–26)
CALCIUM: 9.3 mg/dL (ref 8.4–10.4)
CHLORIDE: 104 mmol/L (ref 98–109)
CO2: 23 mmol/L (ref 22–29)
Creatinine: 0.76 mg/dL (ref 0.60–1.10)
Glucose, Bld: 84 mg/dL (ref 70–140)
Potassium: 3.9 mmol/L (ref 3.5–5.1)
SODIUM: 138 mmol/L (ref 136–145)
Total Bilirubin: 0.6 mg/dL (ref 0.2–1.2)
Total Protein: 7 g/dL (ref 6.4–8.3)

## 2017-11-25 LAB — LACTATE DEHYDROGENASE: LDH: 179 U/L (ref 125–245)

## 2017-11-25 NOTE — Progress Notes (Signed)
Hematology and Oncology Follow Up Visit  Jillian Barnes [id number] July 18, 1971 47 y.o. 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  Barnes is back for her follow-up.  We see her yearly.  She is doing quite well.  She really had no problems since we last saw her.  She is taking oral iron.  This is helped her quite a bit.  Her iron levels have improved nicely.  When we last saw her, her ferritin was 40 with iron saturation of 49%.  She has had no problems with fever.  She is had no cough or shortness of breath.  She has had no change in bowel or bladder habits.  She has had no leg swelling.  She is had no rashes.  Overall, her performance status is ECOG 0.    Medications:  Current Outpatient Medications:  .  Cholecalciferol (VITAMIN D3) 2000 UNITS TABS, Take 2,000 Units by mouth daily., Disp: , Rfl:  .  metoprolol succinate (TOPROL-XL) 50 MG 24 hr tablet, Take 100 mg by mouth Daily. , Disp: , Rfl:  .  Nutritional Supplements (JUICE PLUS FIBRE PO), Take by mouth every morning., Disp: , Rfl:  .  SYNTHROID 100 MCG tablet, Take 10 mcg by mouth Daily., Disp: , Rfl:   Allergies:  Allergies  Allergen Reactions  . Ciprofloxacin Rash  . Sulfa Antibiotics Rash  . Sulfamethoxazole Rash    Past Medical History, Surgical history, Social history, and Family History were reviewed and updated.  Review of Systems: Review of Systems  Constitutional: Negative.   HENT: Negative.   Eyes: Negative.   Respiratory: Negative.   Cardiovascular: Negative.   Gastrointestinal: Negative.   Genitourinary: Negative.   Musculoskeletal: Negative.   Skin: Negative.   Neurological: Negative.   Endo/Heme/Allergies: Negative.   Psychiatric/Behavioral: Negative.     Physical Exam:  weight is 179 lb (81.2 kg). Her oral temperature is 98.5 F  (36.9 C). Her blood pressure is 137/84 and her pulse is 71. Her respiration is 16 and oxygen saturation is 100%.   Physical Exam  Constitutional: She is oriented to person, place, and time.  HENT:  Head: Normocephalic and atraumatic.  Mouth/Throat: Oropharynx is clear and moist.  Eyes: Pupils are equal, round, and reactive to light. EOM are normal.  Neck: Normal range of motion.  Cardiovascular: Normal rate, regular rhythm and normal heart sounds.  Pulmonary/Chest: Effort normal and breath sounds normal.  Abdominal: Soft. Bowel sounds are normal.  Musculoskeletal: Normal range of motion. She exhibits no edema, tenderness or deformity.  Lymphadenopathy:    She has no cervical adenopathy.  Neurological: She is alert and oriented to person, place, and time.  Skin: Skin is warm and dry. No rash noted. No erythema.  Psychiatric: She has a normal mood and affect. Her behavior is normal. Judgment and thought content normal.  Vitals reviewed.    Lab Results  Component Value Date   WBC 5.7 11/25/2017   HGB 13.2 11/25/2017   HCT 38.7 11/25/2017   MCV 90.0 11/25/2017   PLT 289 11/25/2017     Chemistry      Component Value Date/Time   NA 137 01/10/2017 1201   K 3.9 01/10/2017 1201   CL 103 11/26/2016 1328   CO2 24 01/10/2017 1201   BUN 12.4 01/10/2017 1201   CREATININE 0.8 01/10/2017 1201  Component Value Date/Time   CALCIUM 9.6 01/10/2017 1201   ALKPHOS 64 01/10/2017 1201   AST 13 01/10/2017 1201   ALT 15 01/10/2017 1201   BILITOT 0.75 01/10/2017 1201         Impression and Plan: Jillian Barnes is a 47 year old Jillian Barnes female with a history of early stage Hodgkin's disease. In my mind, she's cured. I think has a recurrence is easily less than 5%.  She is now 12 years from treatment.  At this point, we will still see her back yearly.  Her blood pressure will have to be watched.  This usually is on the higher side.   [name], MD 4/22/20192:11 PM

## 2017-11-26 ENCOUNTER — Telehealth: Payer: Self-pay | Admitting: *Deleted

## 2017-11-26 LAB — FERRITIN: Ferritin: 49 ng/mL (ref 9–269)

## 2017-11-26 LAB — IRON AND TIBC
IRON: 51 ug/dL (ref 41–142)
SATURATION RATIOS: 15 % — AB (ref 21–57)
TIBC: 336 ug/dL (ref 236–444)
UIBC: 285 ug/dL

## 2017-11-26 LAB — TSH: TSH: 0.357 u[IU]/mL (ref 0.308–3.960)

## 2017-11-26 NOTE — Telephone Encounter (Signed)
-----   Message from [name], MD sent at 11/26/2017  9:06 AM EDT ----- Call - the iron is low but not too bad.  Keep taking oral iron daily!!!  Laurey Arrow

## 2017-11-26 NOTE — Telephone Encounter (Signed)
Patient notified per order of Dr. Marin Olp that thyroid level is ok, that iron is low but not too bad and to keep taking oral iron daily.  Patient appreciative of call and has no questions at this time.

## 2018-04-14 ENCOUNTER — Other Ambulatory Visit: Payer: Self-pay | Admitting: Obstetrics and Gynecology

## 2018-04-14 DIAGNOSIS — R928 Other abnormal and inconclusive findings on diagnostic imaging of breast: Secondary | ICD-10-CM

## 2018-04-15 ENCOUNTER — Ambulatory Visit
Admission: RE | Admit: 2018-04-15 | Discharge: 2018-04-15 | Disposition: A | Discharge status: Home / Self Care | Payer: BC Managed Care – PPO | Source: Ambulatory Visit | Attending: Obstetrics and Gynecology | Admitting: Obstetrics and Gynecology

## 2018-04-15 DIAGNOSIS — R928 Other abnormal and inconclusive findings on diagnostic imaging of breast: Secondary | ICD-10-CM

## 2018-11-26 ENCOUNTER — Inpatient Hospital Stay: Payer: BC Managed Care – PPO | Attending: Hematology & Oncology

## 2018-11-26 ENCOUNTER — Other Ambulatory Visit: Payer: Self-pay

## 2018-11-26 ENCOUNTER — Inpatient Hospital Stay: Payer: BC Managed Care – PPO | Admitting: Hematology & Oncology

## 2018-11-26 VITALS — BP 123/86 | HR 74 | Temp 98.6°F | Resp 18 | Wt 169.5 lb

## 2018-11-26 DIAGNOSIS — Z923 Personal history of irradiation: Secondary | ICD-10-CM

## 2018-11-26 DIAGNOSIS — N921 Excessive and frequent menstruation with irregular cycle: Secondary | ICD-10-CM | POA: Insufficient documentation

## 2018-11-26 DIAGNOSIS — Z79899 Other long term (current) drug therapy: Secondary | ICD-10-CM

## 2018-11-26 DIAGNOSIS — Z8571 Personal history of Hodgkin lymphoma: Secondary | ICD-10-CM | POA: Diagnosis present

## 2018-11-26 DIAGNOSIS — C8100 Nodular lymphocyte predominant Hodgkin lymphoma, unspecified site: Secondary | ICD-10-CM

## 2018-11-26 DIAGNOSIS — Z881 Allergy status to other antibiotic agents status: Secondary | ICD-10-CM | POA: Insufficient documentation

## 2018-11-26 DIAGNOSIS — E039 Hypothyroidism, unspecified: Secondary | ICD-10-CM

## 2018-11-26 DIAGNOSIS — D500 Iron deficiency anemia secondary to blood loss (chronic): Secondary | ICD-10-CM | POA: Insufficient documentation

## 2018-11-26 DIAGNOSIS — M818 Other osteoporosis without current pathological fracture: Secondary | ICD-10-CM

## 2018-11-26 DIAGNOSIS — Z882 Allergy status to sulfonamides status: Secondary | ICD-10-CM

## 2018-11-26 DIAGNOSIS — C8101 Nodular lymphocyte predominant Hodgkin lymphoma, lymph nodes of head, face, and neck: Secondary | ICD-10-CM

## 2018-11-26 LAB — CBC WITH DIFFERENTIAL (CANCER CENTER ONLY)
Abs Immature Granulocytes: 0.02 10*3/uL (ref 0.00–0.07)
Basophils Absolute: 0.1 10*3/uL (ref 0.0–0.1)
Basophils Relative: 1 %
Eosinophils Absolute: 0.2 10*3/uL (ref 0.0–0.5)
Eosinophils Relative: 4 %
HCT: 41.2 % (ref 36.0–46.0)
Hemoglobin: 13.5 g/dL (ref 12.0–15.0)
Immature Granulocytes: 0 %
Lymphocytes Relative: 32 %
Lymphs Abs: 1.9 10*3/uL (ref 0.7–4.0)
MCH: 30.1 pg (ref 26.0–34.0)
MCHC: 32.8 g/dL (ref 30.0–36.0)
MCV: 91.8 fL (ref 80.0–100.0)
Monocytes Absolute: 0.4 10*3/uL (ref 0.1–1.0)
Monocytes Relative: 7 %
Neutro Abs: 3.3 10*3/uL (ref 1.7–7.7)
Neutrophils Relative %: 56 %
Platelet Count: 293 10*3/uL (ref 150–400)
RBC: 4.49 MIL/uL (ref 3.87–5.11)
RDW: 13.3 % (ref 11.5–15.5)
WBC Count: 5.9 10*3/uL (ref 4.0–10.5)
nRBC: 0 % (ref 0.0–0.2)

## 2018-11-26 LAB — CMP (CANCER CENTER ONLY)
ALT: 12 U/L (ref 0–44)
AST: 11 U/L — ABNORMAL LOW (ref 15–41)
Albumin: 4.3 g/dL (ref 3.5–5.0)
Alkaline Phosphatase: 57 U/L (ref 38–126)
Anion gap: 8 (ref 5–15)
BUN: 10 mg/dL (ref 6–20)
CO2: 25 mmol/L (ref 22–32)
Calcium: 9.1 mg/dL (ref 8.9–10.3)
Chloride: 104 mmol/L (ref 98–111)
Creatinine: 0.74 mg/dL (ref 0.44–1.00)
GFR, Est AFR Am: >60 mL/min (ref >60)
GFR, Estimated: >60 mL/min (ref >60)
Glucose, Bld: 103 mg/dL — ABNORMAL HIGH (ref 70–99)
Potassium: 3.8 mmol/L (ref 3.5–5.1)
Sodium: 137 mmol/L (ref 135–145)
Total Bilirubin: 0.9 mg/dL (ref 0.3–1.2)
Total Protein: 6.8 g/dL (ref 6.5–8.1)

## 2018-11-26 LAB — LACTATE DEHYDROGENASE: LDH: 170 U/L (ref 98–192)

## 2018-11-26 NOTE — Progress Notes (Signed)
Hematology and Oncology Follow Up Visit  Jillian Barnes [id number] [birth year] 48 y.o. 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  Barnes is back for her follow-up.  We see her yearly.  Jillian Barnes like to come back to see [place] yearly.  Jillian Barnes just feels confidence in our exams and that we will do blood work that can help her out.  It is now been 13 years since Jillian Barnes had her Hodgkin's.  Jillian Barnes really has done nicely.  Jillian Barnes is had no problems with recurrence.  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  Jillian Barnes is had no issues with the hypothyroidism.  Jillian Barnes does have intermittent iron deficiency anemia.  Jillian Barnes did get a dose of IV iron.  We do have an oral supplement.  Jillian Barnes is still working during the coronavirus outbreak.  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  Jillian Barnes really wishes Jillian Barnes could go to [employer].  Jillian Barnes loves going to [employer].  Jillian Barnes is not sure when Jillian Barnes will be able to go again.  Jillian Barnes has had no bleeding.  Jillian Barnes has had no nausea or vomiting.  Jillian Barnes has had no headache.   Overall, her performance status is ECOG 0.    Medications:  Current Outpatient Medications:  .  Cholecalciferol (VITAMIN D3) 2000 UNITS TABS, Take 2,000 Units by mouth daily., Disp: , Rfl:  .  metoprolol succinate (TOPROL-XL) 50 MG 24 hr tablet, Take 100 mg by mouth Daily. , Disp: , Rfl:  .  Nutritional Supplements (JUICE PLUS FIBRE PO), Take by mouth every morning., Disp: , Rfl:  .  SYNTHROID 100 MCG tablet, Take 10 mcg by mouth Daily., Disp: , Rfl:   Allergies:  Allergies  Allergen Reactions  . Ciprofloxacin Rash  . Sulfa Antibiotics Rash  . Sulfamethoxazole Rash    Past Medical History, Surgical history, Social history, and Family History were reviewed and updated.  Review of Systems: Review of Systems  Constitutional:  Negative.   HENT: Negative.   Eyes: Negative.   Respiratory: Negative.   Cardiovascular: Negative.   Gastrointestinal: Negative.   Genitourinary: Negative.   Musculoskeletal: Negative.   Skin: Negative.   Neurological: Negative.   Endo/Heme/Allergies: Negative.   Psychiatric/Behavioral: Negative.     Physical Exam:  weight is 169 lb 8 oz (76.9 kg). Her oral temperature is 98.6 F (37 C). Her blood pressure is 123/86 and her pulse is 74. Her respiration is 18.   Physical Exam Vitals signs reviewed.  HENT:     Head: Normocephalic and atraumatic.  Eyes:     Pupils: Pupils are equal, round, and reactive to light.  Neck:     Musculoskeletal: Normal range of motion.  Cardiovascular:     Rate and Rhythm: Normal rate and regular rhythm.     Heart sounds: Normal heart sounds.  Pulmonary:     Effort: Pulmonary effort is normal.     Breath sounds: Normal breath sounds.  Abdominal:     General: Bowel sounds are normal.     Palpations: Abdomen is soft.  Musculoskeletal: Normal range of motion.        General: No tenderness or deformity.  Lymphadenopathy:     Cervical: No cervical adenopathy.  Skin:    General: Skin is warm and dry.     Findings: No erythema or  rash.  Neurological:     Mental Status: Jillian Barnes is alert and oriented to person, place, and time.  Psychiatric:        Behavior: Behavior normal.        Thought Content: Thought content normal.        Judgment: Judgment normal.      Lab Results  Component Value Date   WBC 5.9 11/26/2018   HGB 13.5 11/26/2018   HCT 41.2 11/26/2018   MCV 91.8 11/26/2018   PLT 293 11/26/2018     Chemistry      Component Value Date/Time   NA 137 11/26/2018 1330   NA 137 01/10/2017 1201   K 3.8 11/26/2018 1330   K 3.9 01/10/2017 1201   CL 104 11/26/2018 1330   CL 103 11/26/2016 1328   CO2 25 11/26/2018 1330   CO2 24 01/10/2017 1201   BUN 10 11/26/2018 1330   BUN 12.4 01/10/2017 1201   CREATININE 0.74 11/26/2018 1330    CREATININE 0.8 01/10/2017 1201      Component Value Date/Time   CALCIUM 9.1 11/26/2018 1330   CALCIUM 9.6 01/10/2017 1201   ALKPHOS 57 11/26/2018 1330   ALKPHOS 64 01/10/2017 1201   AST 11 (L) 11/26/2018 1330   AST 13 01/10/2017 1201   ALT 12 11/26/2018 1330   ALT 15 01/10/2017 1201   BILITOT 0.9 11/26/2018 1330   BILITOT 0.75 01/10/2017 1201         Impression and Plan: Jillian Barnes is a 48 year old [place] female with a history of early stage Hodgkin's disease. In my mind, Jillian Barnes's cured. I think has a recurrence is easily less than 5%.  Jillian Barnes is now 13 years from treatment.  At this point, we will still see her back yearly.  I forgot to mention that Jillian Barnes now is on blood pressure medication.  Jillian Barnes says Jillian Barnes actually began to feel better once Jillian Barnes got on blood pressure medication.   Jillian Napoleon, MD 4/22/20202:22 PM

## 2018-11-27 ENCOUNTER — Telehealth: Payer: Self-pay | Admitting: *Deleted

## 2018-11-27 ENCOUNTER — Telehealth: Payer: Self-pay | Admitting: Hematology & Oncology

## 2018-11-27 LAB — TSH: TSH: 0.417 u[IU]/mL (ref 0.308–3.960)

## 2018-11-27 LAB — VITAMIN D 25 HYDROXY (VIT D DEFICIENCY, FRACTURES): Vit D, 25-Hydroxy: 33.5 ng/mL (ref 30.0–100.0)

## 2018-11-27 NOTE — Telephone Encounter (Signed)
-----   Message from [name], MD sent at 11/27/2018  8:07 AM EDT ----- Call - Vit D level is great!!!  Laurey Arrow

## 2018-11-27 NOTE — Telephone Encounter (Signed)
sw pt to confirm 11/26/19 appt at 1 pm per 4/22 los

## 2018-11-27 NOTE — Telephone Encounter (Signed)
Notified pt of results.No further concerns. 

## 2019-11-26 ENCOUNTER — Inpatient Hospital Stay: Payer: BC Managed Care – PPO | Attending: Hematology & Oncology | Admitting: Hematology & Oncology

## 2019-11-26 ENCOUNTER — Encounter: Payer: Self-pay | Admitting: Hematology & Oncology

## 2019-11-26 ENCOUNTER — Inpatient Hospital Stay: Payer: BC Managed Care – PPO

## 2019-11-26 ENCOUNTER — Other Ambulatory Visit: Payer: Self-pay

## 2019-11-26 VITALS — BP 117/72 | HR 61 | Temp 97.8°F | Resp 16 | Wt 169.0 lb

## 2019-11-26 DIAGNOSIS — C8100 Nodular lymphocyte predominant Hodgkin lymphoma, unspecified site: Secondary | ICD-10-CM

## 2019-11-26 DIAGNOSIS — Z79899 Other long term (current) drug therapy: Secondary | ICD-10-CM | POA: Diagnosis not present

## 2019-11-26 DIAGNOSIS — N921 Excessive and frequent menstruation with irregular cycle: Secondary | ICD-10-CM | POA: Insufficient documentation

## 2019-11-26 DIAGNOSIS — R197 Diarrhea, unspecified: Secondary | ICD-10-CM | POA: Diagnosis not present

## 2019-11-26 DIAGNOSIS — C8190 Hodgkin lymphoma, unspecified, unspecified site: Secondary | ICD-10-CM | POA: Diagnosis present

## 2019-11-26 DIAGNOSIS — E039 Hypothyroidism, unspecified: Secondary | ICD-10-CM

## 2019-11-26 DIAGNOSIS — D500 Iron deficiency anemia secondary to blood loss (chronic): Secondary | ICD-10-CM | POA: Insufficient documentation

## 2019-11-26 DIAGNOSIS — C8101 Nodular lymphocyte predominant Hodgkin lymphoma, lymph nodes of head, face, and neck: Secondary | ICD-10-CM

## 2019-11-26 DIAGNOSIS — R110 Nausea: Secondary | ICD-10-CM | POA: Diagnosis not present

## 2019-11-26 DIAGNOSIS — Z882 Allergy status to sulfonamides status: Secondary | ICD-10-CM | POA: Insufficient documentation

## 2019-11-26 LAB — CBC WITH DIFFERENTIAL (CANCER CENTER ONLY)
Abs Immature Granulocytes: 0.02 10*3/uL (ref 0.00–0.07)
Basophils Absolute: 0.1 10*3/uL (ref 0.0–0.1)
Basophils Relative: 1 %
Eosinophils Absolute: 0.3 10*3/uL (ref 0.0–0.5)
Eosinophils Relative: 4 %
HCT: 41.6 % (ref 36.0–46.0)
Hemoglobin: 13.8 g/dL (ref 12.0–15.0)
Immature Granulocytes: 0 %
Lymphocytes Relative: 36 %
Lymphs Abs: 2.5 10*3/uL (ref 0.7–4.0)
MCH: 29.8 pg (ref 26.0–34.0)
MCHC: 33.2 g/dL (ref 30.0–36.0)
MCV: 89.8 fL (ref 80.0–100.0)
Monocytes Absolute: 0.6 10*3/uL (ref 0.1–1.0)
Monocytes Relative: 9 %
Neutro Abs: 3.5 10*3/uL (ref 1.7–7.7)
Neutrophils Relative %: 50 %
Platelet Count: 312 10*3/uL (ref 150–400)
RBC: 4.63 MIL/uL (ref 3.87–5.11)
RDW: 12.9 % (ref 11.5–15.5)
WBC Count: 7 10*3/uL (ref 4.0–10.5)
nRBC: 0 % (ref 0.0–0.2)

## 2019-11-26 LAB — CMP (CANCER CENTER ONLY)
ALT: 13 U/L (ref 0–44)
AST: 12 U/L — ABNORMAL LOW (ref 15–41)
Albumin: 4.4 g/dL (ref 3.5–5.0)
Alkaline Phosphatase: 57 U/L (ref 38–126)
Anion gap: 7 (ref 5–15)
BUN: 13 mg/dL (ref 6–20)
CO2: 26 mmol/L (ref 22–32)
Calcium: 9.6 mg/dL (ref 8.9–10.3)
Chloride: 102 mmol/L (ref 98–111)
Creatinine: 0.83 mg/dL (ref 0.44–1.00)
GFR, Est AFR Am: >60 mL/min (ref >60)
GFR, Estimated: >60 mL/min (ref >60)
Glucose, Bld: 83 mg/dL (ref 70–99)
Potassium: 4.1 mmol/L (ref 3.5–5.1)
Sodium: 135 mmol/L (ref 135–145)
Total Bilirubin: 0.8 mg/dL (ref 0.3–1.2)
Total Protein: 7.2 g/dL (ref 6.5–8.1)

## 2019-11-26 NOTE — Progress Notes (Signed)
Hematology and Oncology Follow Up Visit  Jillian Barnes [id number] 06-27-1971 49 y.o. 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  Barnes is back for Jillian follow-up.  We see Jillian yearly.  She like to come back to see [place] yearly.  She just feels confidence in our exams and that we will do blood work that can help Jillian out.  It is now been 14 years since she had Jillian Hodgkin's disease treated.  She really has done nicely.  She is had no problems with recurrence.   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  She is had no issues with the hypothyroidism.  She did have intermittent iron deficiency anemia.  She did get a dose of IV iron.  We do have Jillian on an oral supplement.  Jillian problem right now is that she is having some abdominal issues.  She is having some diarrhea problems.  She seems to be having more issues with digestion.  There is no vomiting.  She has little bit of nausea.  Of note, Jillian Barnes had his gallbladder taken out recently.  I am quite surprised by this.  Thankfully, she is gone through the coronavirus outbreak.  She is still working.  She has not had the vaccine yet.  Overall, Jillian performance status is ECOG 0.    Medications:  Current Outpatient Medications:  .  Cholecalciferol (VITAMIN D3) 2000 UNITS TABS, Take 2,000 Units by mouth daily., Disp: , Rfl:  .  metoprolol succinate (TOPROL-XL) 50 MG 24 hr tablet, Take 100 mg by mouth Daily. , Disp: , Rfl:  .  SYNTHROID 100 MCG tablet, Take 10 mcg by mouth Daily., Disp: , Rfl:  .  valsartan (DIOVAN) 320 MG tablet, Take 160 mg by mouth every morning., Disp: , Rfl:   Allergies:  Allergies  Allergen Reactions  . Ciprofloxacin Rash  . Sulfa Antibiotics Rash  . Sulfamethoxazole Rash    Past Medical History, Surgical  history, Social history, and Family History were reviewed and updated.  Review of Systems: Review of Systems  Constitutional: Negative.   HENT: Negative.   Eyes: Negative.   Respiratory: Negative.   Cardiovascular: Negative.   Gastrointestinal: Negative.   Genitourinary: Negative.   Musculoskeletal: Negative.   Skin: Negative.   Neurological: Negative.   Endo/Heme/Allergies: Negative.   Psychiatric/Behavioral: Negative.     Physical Exam:  weight is 169 lb (76.7 kg). Jillian temporal temperature is 97.8 F (36.6 C). Jillian blood pressure is 117/72 and Jillian pulse is 61. Jillian respiration is 16 and oxygen saturation is 100%.   Physical Exam Vitals reviewed.  HENT:     Head: Normocephalic and atraumatic.  Eyes:     Pupils: Pupils are equal, round, and reactive to light.  Cardiovascular:     Rate and Rhythm: Normal rate and regular rhythm.     Heart sounds: Normal heart sounds.  Pulmonary:     Effort: Pulmonary effort is normal.     Breath sounds: Normal breath sounds.  Abdominal:     General: Bowel sounds are normal.     Palpations: Abdomen is soft.  Musculoskeletal:        General: No tenderness or deformity. Normal range of motion.     Cervical back: Normal range of motion.  Lymphadenopathy:     Cervical: No  cervical adenopathy.  Skin:    General: Skin is warm and dry.     Findings: No erythema or rash.  Neurological:     Mental Status: She is alert and oriented to person, place, and time.  Psychiatric:        Behavior: Behavior normal.        Thought Content: Thought content normal.        Judgment: Judgment normal.      Lab Results  Component Value Date   WBC 7.0 11/26/2019   HGB 13.8 11/26/2019   HCT 41.6 11/26/2019   MCV 89.8 11/26/2019   PLT 312 11/26/2019     Chemistry      Component Value Date/Time   NA 135 11/26/2019 1303   NA 137 01/10/2017 1201   K 4.1 11/26/2019 1303   K 3.9 01/10/2017 1201   CL 102 11/26/2019 1303   CL 103 11/26/2016 1328   CO2  26 11/26/2019 1303   CO2 24 01/10/2017 1201   BUN 13 11/26/2019 1303   BUN 12.4 01/10/2017 1201   CREATININE 0.83 11/26/2019 1303   CREATININE 0.8 01/10/2017 1201      Component Value Date/Time   CALCIUM 9.6 11/26/2019 1303   CALCIUM 9.6 01/10/2017 1201   ALKPHOS 57 11/26/2019 1303   ALKPHOS 64 01/10/2017 1201   AST 12 (L) 11/26/2019 1303   AST 13 01/10/2017 1201   ALT 13 11/26/2019 1303   ALT 15 01/10/2017 1201   BILITOT 0.8 11/26/2019 1303   BILITOT 0.75 01/10/2017 1201         Impression and Plan: Jillian Barnes is a 49 year old [place] female with a history of early stage Hodgkin's disease. In my mind, she's cured. I think has a recurrence is easily less than 5%.  She is now 14 years out from treatment.  We will see about referring Jillian to one of our gastroenterologist in the building.  I am sure that they will be able to help Jillian out and try to sort out what is going on with this digestion problem.  As always, we will see Jillian back yearly.  We can deftly get Jillian back sooner if she is having any issues.   Jillian Napoleon, MD 4/22/20211:59 PM

## 2019-11-27 ENCOUNTER — Telehealth: Payer: Self-pay | Admitting: *Deleted

## 2019-11-27 LAB — IRON AND TIBC
Iron: 61 ug/dL (ref 41–142)
Saturation Ratios: 17 % — ABNORMAL LOW (ref 21–57)
TIBC: 356 ug/dL (ref 236–444)
UIBC: 295 ug/dL (ref 120–384)

## 2019-11-27 LAB — TSH: TSH: 0.446 u[IU]/mL (ref 0.308–3.960)

## 2019-11-27 LAB — FERRITIN: Ferritin: 68 ng/mL (ref 11–307)

## 2019-11-27 NOTE — Telephone Encounter (Signed)
-----   Message from [name], MD sent at 11/27/2019  9:31 AM EDT ----- Call - the thyroid is ok!!  The iron is still a little low!!  We can give a dose of IV iron to bring it up so the blood count will not go down!!  Jillian Barnes

## 2019-11-27 NOTE — Telephone Encounter (Signed)
Pt notified per order of Dr. Marin Olp that "the thyroid is ok!!  The iron is still a little low!!  We can give a dose of IV iron to bring it up so the blood count will not go down!!"  Pt states that she would like to have IV iron.  Message sent to scheduling.

## 2019-11-30 ENCOUNTER — Other Ambulatory Visit: Payer: Self-pay | Admitting: Family

## 2019-12-01 ENCOUNTER — Encounter: Payer: Self-pay | Admitting: Gastroenterology

## 2019-12-02 ENCOUNTER — Other Ambulatory Visit: Payer: Self-pay

## 2019-12-02 ENCOUNTER — Inpatient Hospital Stay: Payer: BC Managed Care – PPO

## 2019-12-02 VITALS — BP 134/76 | HR 66 | Temp 97.3°F | Resp 17

## 2019-12-02 DIAGNOSIS — C8190 Hodgkin lymphoma, unspecified, unspecified site: Secondary | ICD-10-CM | POA: Diagnosis not present

## 2019-12-02 DIAGNOSIS — D500 Iron deficiency anemia secondary to blood loss (chronic): Secondary | ICD-10-CM

## 2019-12-02 DIAGNOSIS — N921 Excessive and frequent menstruation with irregular cycle: Secondary | ICD-10-CM

## 2019-12-02 MED ORDER — SODIUM CHLORIDE 0.9 % IV SOLN
200.0000 mg | Freq: Once | INTRAVENOUS | Status: AC
  Administered 2019-12-02: 200 mg via INTRAVENOUS
  Filled 2019-12-02: qty 200

## 2019-12-02 MED ORDER — SODIUM CHLORIDE 0.9 % IV SOLN
Freq: Once | INTRAVENOUS | Status: AC
  Filled 2019-12-02: qty 250

## 2019-12-02 NOTE — Patient Instructions (Signed)

## 2019-12-09 ENCOUNTER — Other Ambulatory Visit: Payer: Self-pay

## 2019-12-09 ENCOUNTER — Inpatient Hospital Stay: Payer: BC Managed Care – PPO | Attending: Hematology & Oncology

## 2019-12-09 VITALS — BP 126/86 | HR 66 | Temp 97.7°F | Resp 17

## 2019-12-09 DIAGNOSIS — N921 Excessive and frequent menstruation with irregular cycle: Secondary | ICD-10-CM | POA: Insufficient documentation

## 2019-12-09 DIAGNOSIS — E039 Hypothyroidism, unspecified: Secondary | ICD-10-CM | POA: Insufficient documentation

## 2019-12-09 DIAGNOSIS — C8110 Nodular sclerosis classical Hodgkin lymphoma, unspecified site: Secondary | ICD-10-CM | POA: Insufficient documentation

## 2019-12-09 DIAGNOSIS — D500 Iron deficiency anemia secondary to blood loss (chronic): Secondary | ICD-10-CM | POA: Insufficient documentation

## 2019-12-09 MED ORDER — SODIUM CHLORIDE 0.9 % IV SOLN
200.0000 mg | Freq: Once | INTRAVENOUS | Status: AC
  Administered 2019-12-09: 08:00:00 200 mg via INTRAVENOUS
  Filled 2019-12-09: qty 10

## 2019-12-09 MED ORDER — SODIUM CHLORIDE 0.9 % IV SOLN
Freq: Once | INTRAVENOUS | Status: AC
  Filled 2019-12-09: qty 250

## 2019-12-09 NOTE — Patient Instructions (Signed)

## 2019-12-29 ENCOUNTER — Ambulatory Visit: Payer: BC Managed Care – PPO | Admitting: Gastroenterology

## 2019-12-29 ENCOUNTER — Other Ambulatory Visit: Payer: Self-pay

## 2019-12-29 ENCOUNTER — Encounter: Payer: Self-pay | Admitting: Gastroenterology

## 2019-12-29 VITALS — BP 108/80 | HR 69 | Temp 97.8°F | Ht 67.0 in | Wt 171.2 lb

## 2019-12-29 DIAGNOSIS — D509 Iron deficiency anemia, unspecified: Secondary | ICD-10-CM

## 2019-12-29 DIAGNOSIS — K580 Irritable bowel syndrome with diarrhea: Secondary | ICD-10-CM

## 2019-12-29 MED ORDER — DICYCLOMINE HCL 10 MG PO CAPS
10.0000 mg | ORAL_CAPSULE | Freq: Two times a day (BID) | ORAL | 2 refills | Status: DC
Start: 2019-12-29 — End: 2020-08-11

## 2019-12-29 NOTE — Patient Instructions (Addendum)
If you are age 49 or older, your body mass index should be between 23-30. Your Body mass index is 26.82 kg/m. If this is out of the aforementioned range listed, please consider follow up with your Primary Care Provider.  If you are age 25 or younger, your body mass index should be between 19-25. Your Body mass index is 26.82 kg/m. If this is out of the aformentioned range listed, please consider follow up with your Primary Care Provider.   We have sent bentyl to your pharmacy  Please get lab done at [place], [place] at the basement level. No appointment is needed  Please call in 12 weeks to schedule follow up appointment.  Thank you for choosing me and [facility].   Jackquline Denmark, MD

## 2019-12-29 NOTE — Progress Notes (Signed)
Chief Complaint: Diarrhea  Referring Provider:  Algis Greenhouse, MD      ASSESSMENT AND PLAN;   #1. IBS-D, neg colon to TI 03/04/2017 (Dr Amedeo Plenty, good prep-report sent for scanning, rpt in 55yr)  #2. H/O IDA d/t menorrhagia, more or less resolved after uterine ablation.   Plan: -Check celiac screen -Bentyl 10mg  po Bid #60 1/2hr before dinner and bed time. -Call in 2 weeks -FU in 12 weeks.  If still with problems, will perform further work-up by means of stool studies.  She would like to hold off on EGD/US/CT at the present time.  I do agree.   HPI:    Jillian Barnes is a 49 y.o. female   C/O of postprandial diarrhea after dinner x several years, worst over last 3 yrs. after eating salads, or eating out.  More prominently with fish, sushi, egg but can happen with any foods.  Associated with urgency, bloating, lower abdominal discomfort which gets better with defecation.  No nocturnal symptoms.  No melena or hematochezia.  No weight loss.  Had negative colonoscopy to TI 02/2017 with good preparation.  Recommended to repeat in 10 years.  Denies having any upper GI symptoms including nausea, vomiting, heartburn, regurgitation, odynophagia or dysphagia.  No fever chills or night sweats.  Does admit that her symptoms are worse when she is under stress or during menstrual cycles.  Used to have heavy menstrual cycles and had iron deficiency anemia which has been better ever since she had endometrial ablation.  No family history of colon cancer or colonic polyps.  No sodas, chocolates, chewing gums, artificial sweeteners and candy. No NSAIDs  No history suggestive of lactose intolerance or gluten intolerance.  SH -school treasure, 2 boys, married.  49 year old had gallstones. Past Medical History:  Diagnosis Date  . Anemia   . Arrhythmia   . Hodgkin disease ([place]) 09/27/2011  . Hypertension   . Thyroid disease     Past Surgical History:  Procedure Laterality Date  . BREAST  BIOPSY    . COLONOSCOPY  03/04/2017   The entire examined colon is normal. [facility] Dr. Amedeo Plenty    Family History  Problem Relation Age of Onset  . Breast cancer Maternal Grandmother   . Heart attack Maternal Grandmother   . Heart attack Father   . Colon cancer Neg Hx   . Esophageal cancer Neg Hx     Social History   Tobacco Use  . Smoking status: Never Smoker  . Smokeless tobacco: Never Used  . Tobacco comment: quit 25 years ago  Substance Use Topics  . Alcohol use: Not Currently    Alcohol/week: 0.0 standard drinks  . Drug use: Never    Current Outpatient Medications  Medication Sig Dispense Refill  . Cholecalciferol (VITAMIN D3) 2000 UNITS TABS Take 2,000 Units by mouth daily.    . metoprolol succinate (TOPROL-XL) 50 MG 24 hr tablet Take 50 mg by mouth Daily.     Marland Kitchen SYNTHROID 100 MCG tablet Take 100 mcg by mouth Daily.     . valsartan (DIOVAN) 320 MG tablet Take 160 mg by mouth every morning.     No current facility-administered medications for this visit.    Allergies  Allergen Reactions  . Ciprofloxacin Rash  . Sulfa Antibiotics Rash  . Sulfamethoxazole Rash    Review of Systems:  Constitutional: Denies fever, chills, diaphoresis, appetite change and fatigue.  HEENT: Denies photophobia, eye pain, redness, hearing loss, ear pain, congestion, sore throat,  rhinorrhea, sneezing, mouth sores, neck pain, neck stiffness and tinnitus.   Respiratory: Denies SOB, DOE, cough, chest tightness,  and wheezing.   Cardiovascular: Denies chest pain, palpitations and leg swelling.  Genitourinary: Denies dysuria, urgency, frequency, hematuria, flank pain and difficulty urinating.  Musculoskeletal: Denies myalgias, back pain, joint swelling, arthralgias and gait problem.  Skin: No rash.  Neurological: Denies dizziness, seizures, syncope, weakness, light-headedness, numbness and headaches.  Hematological: Denies adenopathy. Easy bruising, personal or family bleeding history   Psychiatric/Behavioral: No anxiety or depression     Physical Exam:    BP 108/80   Pulse 69   Temp 97.8 F (36.6 C)   Ht 5\' 7"  (1.702 m)   Wt 171 lb 4 oz (77.7 kg)   BMI 26.82 kg/m  Wt Readings from Last 3 Encounters:  12/29/19 171 lb 4 oz (77.7 kg)  11/26/19 169 lb (76.7 kg)  11/26/18 169 lb 8 oz (76.9 kg)   Constitutional:  Well-developed, in no acute distress. Psychiatric: Normal mood and affect. Behavior is normal. HEENT: Pupils normal.  Conjunctivae are normal. No scleral icterus. Neck supple.  Cardiovascular: Normal rate, regular rhythm. No edema Pulmonary/chest: Effort normal and breath sounds normal. No wheezing, rales or rhonchi. Abdominal: Soft, nondistended. Nontender. Bowel sounds active throughout. There are no masses palpable. No hepatomegaly. Rectal:  defered Neurological: Alert and oriented to person place and time. Skin: Skin is warm and dry. No rashes noted.  Data Reviewed: I have personally reviewed following labs and imaging studies  CBC: CBC Latest Ref Rng & Units 11/26/2019 11/26/2018 11/25/2017  WBC 4.0 - 10.5 K/uL 7.0 5.9 5.7  Hemoglobin 12.0 - 15.0 g/dL 13.8 13.5 13.2  Hematocrit 36.0 - 46.0 % 41.6 41.2 38.7  Platelets 150 - 400 K/uL 312 293 289    CMP: CMP Latest Ref Rng & Units 11/26/2019 11/26/2018 11/25/2017  Glucose 70 - 99 mg/dL 83 103(H) 84  BUN 6 - 20 mg/dL 13 10 10   Creatinine 0.44 - 1.00 mg/dL 0.83 0.74 0.76  Sodium 135 - 145 mmol/L 135 137 138  Potassium 3.5 - 5.1 mmol/L 4.1 3.8 3.9  Chloride 98 - 111 mmol/L 102 104 104  CO2 22 - 32 mmol/L 26 25 23   Calcium 8.9 - 10.3 mg/dL 9.6 9.1 9.3  Total Protein 6.5 - 8.1 g/dL 7.2 6.8 7.0  Total Bilirubin 0.3 - 1.2 mg/dL 0.8 0.9 0.6  Alkaline Phos 38 - 126 U/L 57 57 60  AST 15 - 41 U/L 12(L) 11(L) 14  ALT 0 - 44 U/L 13 12 15       Carmell Austria, MD 12/29/2019, 3:43 PM  Cc: Algis Greenhouse, MD

## 2020-05-17 ENCOUNTER — Other Ambulatory Visit: Payer: BC Managed Care – PPO

## 2020-05-17 DIAGNOSIS — K580 Irritable bowel syndrome with diarrhea: Secondary | ICD-10-CM

## 2020-05-17 DIAGNOSIS — D509 Iron deficiency anemia, unspecified: Secondary | ICD-10-CM

## 2020-05-19 LAB — CELIAC PANEL 10
Antigliadin Abs, IgA: 5 units (ref 0–19)
Endomysial IgA: NEGATIVE
Gliadin IgG: 2 units (ref 0–19)
IgA/Immunoglobulin A, Serum: 244 mg/dL (ref 87–352)
Tissue Transglut Ab: 4 U/mL (ref 0–5)
Transglutaminase IgA: <2 U/mL (ref 0–3)

## 2020-06-27 ENCOUNTER — Ambulatory Visit: Payer: BC Managed Care – PPO | Admitting: Gastroenterology

## 2020-08-11 ENCOUNTER — Ambulatory Visit: Payer: Self-pay | Admitting: Gastroenterology

## 2020-08-11 ENCOUNTER — Encounter: Payer: Self-pay | Admitting: Gastroenterology

## 2020-08-11 VITALS — BP 128/72 | HR 69 | Ht 67.0 in | Wt 171.0 lb

## 2020-08-11 DIAGNOSIS — K580 Irritable bowel syndrome with diarrhea: Secondary | ICD-10-CM

## 2020-08-11 MED ORDER — DICYCLOMINE HCL 10 MG PO CAPS: 10.0000 mg | ORAL_CAPSULE | Freq: Two times a day (BID) | ORAL | 11 refills | Status: DC

## 2020-08-11 NOTE — Patient Instructions (Signed)
If you are age 50 or older, your body mass index should be between 23-30. Your Body mass index is 26.78 kg/m. If this is out of the aforementioned range listed, please consider follow up with your Primary Care Provider.  If you are age 42 or younger, your body mass index should be between 19-25. Your Body mass index is 26.78 kg/m. If this is out of the aformentioned range listed, please consider follow up with your Primary Care Provider.   We have sent the following medications to your pharmacy for you to pick up at your convenience: Bentyl  Follow up as needed.   Thank you,  Dr. Lynann Bologna

## 2020-08-11 NOTE — Progress Notes (Signed)
Chief Complaint: Diarrhea  Referring Provider:  Olive Bass, MD      ASSESSMENT AND PLAN;   #1. IBS-D, neg colon to TI 03/04/2017 (Dr Madilyn Fireman, good prep-report sent for scanning, rpt in 49yr). Neg celiac screen  #2. H/O IDA d/t menorrhagia. Resolved after uterine ablation.  Plan:  -Bentyl 10mg  po Bid #60, 11 refills -FU as needed. -Call if any problems.   HPI:    Jillian Barnes is a 50 y.o. female   FU Doing much better  Would have some diarrhea just before her menstrual cycles.  Otherwise doing great.  Had neg celiac screen.  She would like to hold off on any further evaluation by means of CT/ultrasound/upper endoscopy.  Tolerating Bentyl well.  She is now on as-needed basis.  She is going to trip in [place]can cruise from [place] in summer.  I have instructed her that she needs to take Bentyl along.  No nocturnal symptoms.  No melena or hematochezia.  No weight loss.  Had neg colon to TI 02/2017 with good preparation.  Random biopsies were not performed.  Recommended to repeat in 10 years.  Denies having any upper GI symptoms including nausea, vomiting, heartburn, regurgitation, odynophagia or dysphagia.  No fever chills or night sweats.  Does admit that her symptoms are worse when she is under stress or during menstrual cycles.  Used to have heavy menstrual cycles and had iron deficiency anemia which has been better ever since she had endometrial ablation.  No family history of colon cancer or colonic polyps.  No sodas, chocolates, chewing gums, artificial sweeteners and candy. No NSAIDs  No history suggestive of lactose intolerance or gluten intolerance.  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 Past Medical History:  Diagnosis Date  .  Anemia   . Arrhythmia   . Hodgkin disease ([facility]) 09/27/2011  . Hypertension   . Thyroid disease     Past Surgical History:  Procedure Laterality Date  . BREAST BIOPSY    . COLONOSCOPY  03/04/2017   The entire examined colon is normal. [facility] Dr. 03/06/2017    Family History  Problem Relation Age of Onset  . Breast cancer Maternal Grandmother   . Heart attack Maternal Grandmother   . Heart attack Father   . Colon cancer Neg Hx   . Esophageal cancer Neg Hx     Social History   Tobacco Use  . Smoking status: Never Smoker  . Smokeless tobacco: Never Used  . Tobacco comment: quit 25 years ago  Vaping Use  . Vaping Use: Never used  Substance Use Topics  . Alcohol use: Not Currently    Alcohol/week: 0.0 standard drinks  . Drug use: Never    Current Outpatient Medications  Medication Sig Dispense Refill  . Cholecalciferol (VITAMIN D3) 2000 UNITS TABS Take 2,000 Units by mouth daily.    Madilyn Fireman dicyclomine (BENTYL) 10 MG capsule Take 1 capsule (10 mg total) by mouth 2 (two) times daily. Take 1/2 hour before dinner and before bedtime (Patient taking differently: Take 10 mg by mouth as needed.) 180 capsule 2  . metoprolol succinate (TOPROL-XL) 50 MG 24 hr tablet Take 50 mg by mouth Daily.     Marland Kitchen SYNTHROID  100 MCG tablet Take 100 mcg by mouth Daily.     . valsartan (DIOVAN) 320 MG tablet Take 160 mg by mouth every morning.     No current facility-administered medications for this visit.    Allergies  Allergen Reactions  . Ciprofloxacin Rash  . Sulfa Antibiotics Rash  . Sulfamethoxazole Rash    Review of Systems:  neg     Physical Exam:    BP 128/72   Pulse 69   Ht 5\' 7"  (1.702 m)   Wt 171 lb (77.6 kg)   BMI 26.78 kg/m  Wt Readings from Last 3 Encounters:  08/11/20 171 lb (77.6 kg)  12/29/19 171 lb 4 oz (77.7 kg)  11/26/19 169 lb (76.7 kg)   Constitutional:  Well-developed, in no acute distress. Psychiatric: Normal mood and affect. Behavior is normal. HEENT: Pupils  normal.  Conjunctivae are normal. No scleral icterus. Cardiovascular: Normal rate, regular rhythm. No edema Pulmonary/chest: Effort normal and breath sounds normal. No wheezing, rales or rhonchi. Abdominal: Soft, nondistended. Nontender. Bowel sounds active throughout. There are no masses palpable. No hepatomegaly. Rectal:  defered Neurological: Alert and oriented to person place and time. Skin: Skin is warm and dry. No rashes noted.  Data Reviewed: I have personally reviewed following labs and imaging studies  CBC: CBC Latest Ref Rng & Units 11/26/2019 11/26/2018 11/25/2017  WBC 4.0 - 10.5 K/uL 7.0 5.9 5.7  Hemoglobin 12.0 - 15.0 g/dL 13.8 13.5 13.2  Hematocrit 36.0 - 46.0 % 41.6 41.2 38.7  Platelets 150 - 400 K/uL 312 293 289    CMP: CMP Latest Ref Rng & Units 11/26/2019 11/26/2018 11/25/2017  Glucose 70 - 99 mg/dL 83 103(H) 84  BUN 6 - 20 mg/dL 13 10 10   Creatinine 0.44 - 1.00 mg/dL 0.83 0.74 0.76  Sodium 135 - 145 mmol/L 135 137 138  Potassium 3.5 - 5.1 mmol/L 4.1 3.8 3.9  Chloride 98 - 111 mmol/L 102 104 104  CO2 22 - 32 mmol/L 26 25 23   Calcium 8.9 - 10.3 mg/dL 9.6 9.1 9.3  Total Protein 6.5 - 8.1 g/dL 7.2 6.8 7.0  Total Bilirubin 0.3 - 1.2 mg/dL 0.8 0.9 0.6  Alkaline Phos 38 - 126 U/L 57 57 60  AST 15 - 41 U/L 12(L) 11(L) 14  ALT 0 - 44 U/L 13 12 15       Carmell Austria, MD 08/11/2020, 3:48 PM  Cc: Algis Greenhouse, MD

## 2020-11-25 ENCOUNTER — Inpatient Hospital Stay: Payer: BC Managed Care – PPO | Attending: Hematology & Oncology

## 2020-11-25 ENCOUNTER — Inpatient Hospital Stay (HOSPITAL_BASED_OUTPATIENT_CLINIC_OR_DEPARTMENT_OTHER): Payer: BC Managed Care – PPO | Admitting: Hematology & Oncology

## 2020-11-25 ENCOUNTER — Other Ambulatory Visit: Payer: Self-pay

## 2020-11-25 ENCOUNTER — Encounter: Payer: Self-pay | Admitting: Hematology & Oncology

## 2020-11-25 VITALS — BP 132/78 | HR 61 | Temp 98.9°F | Resp 16 | Wt 170.0 lb

## 2020-11-25 DIAGNOSIS — E039 Hypothyroidism, unspecified: Secondary | ICD-10-CM | POA: Insufficient documentation

## 2020-11-25 DIAGNOSIS — Z8571 Personal history of Hodgkin lymphoma: Secondary | ICD-10-CM | POA: Diagnosis present

## 2020-11-25 DIAGNOSIS — C8102 Nodular lymphocyte predominant Hodgkin lymphoma, intrathoracic lymph nodes: Secondary | ICD-10-CM | POA: Diagnosis not present

## 2020-11-25 DIAGNOSIS — Z8616 Personal history of COVID-19: Secondary | ICD-10-CM | POA: Diagnosis not present

## 2020-11-25 DIAGNOSIS — N921 Excessive and frequent menstruation with irregular cycle: Secondary | ICD-10-CM | POA: Diagnosis not present

## 2020-11-25 DIAGNOSIS — D500 Iron deficiency anemia secondary to blood loss (chronic): Secondary | ICD-10-CM

## 2020-11-25 DIAGNOSIS — Z882 Allergy status to sulfonamides status: Secondary | ICD-10-CM | POA: Insufficient documentation

## 2020-11-25 DIAGNOSIS — Z881 Allergy status to other antibiotic agents status: Secondary | ICD-10-CM | POA: Diagnosis not present

## 2020-11-25 DIAGNOSIS — Z79899 Other long term (current) drug therapy: Secondary | ICD-10-CM | POA: Insufficient documentation

## 2020-11-25 DIAGNOSIS — C8100 Nodular lymphocyte predominant Hodgkin lymphoma, unspecified site: Secondary | ICD-10-CM

## 2020-11-25 LAB — CBC WITH DIFFERENTIAL (CANCER CENTER ONLY)
Abs Immature Granulocytes: 0.01 10*3/uL (ref 0.00–0.07)
Basophils Absolute: 0.1 10*3/uL (ref 0.0–0.1)
Basophils Relative: 1 %
Eosinophils Absolute: 0.4 10*3/uL (ref 0.0–0.5)
Eosinophils Relative: 7 %
HCT: 37.7 % (ref 36.0–46.0)
Hemoglobin: 12.5 g/dL (ref 12.0–15.0)
Immature Granulocytes: 0 %
Lymphocytes Relative: 40 %
Lymphs Abs: 2 10*3/uL (ref 0.7–4.0)
MCH: 30.4 pg (ref 26.0–34.0)
MCHC: 33.2 g/dL (ref 30.0–36.0)
MCV: 91.7 fL (ref 80.0–100.0)
Monocytes Absolute: 0.4 10*3/uL (ref 0.1–1.0)
Monocytes Relative: 9 %
Neutro Abs: 2.2 10*3/uL (ref 1.7–7.7)
Neutrophils Relative %: 43 %
Platelet Count: 284 10*3/uL (ref 150–400)
RBC: 4.11 MIL/uL (ref 3.87–5.11)
RDW: 12.6 % (ref 11.5–15.5)
WBC Count: 5.1 10*3/uL (ref 4.0–10.5)
nRBC: 0 % (ref 0.0–0.2)

## 2020-11-25 LAB — CMP (CANCER CENTER ONLY)
ALT: 13 U/L (ref 0–44)
AST: 11 U/L — ABNORMAL LOW (ref 15–41)
Albumin: 4.2 g/dL (ref 3.5–5.0)
Alkaline Phosphatase: 47 U/L (ref 38–126)
Anion gap: 6 (ref 5–15)
BUN: 14 mg/dL (ref 6–20)
CO2: 28 mmol/L (ref 22–32)
Calcium: 9.4 mg/dL (ref 8.9–10.3)
Chloride: 101 mmol/L (ref 98–111)
Creatinine: 0.81 mg/dL (ref 0.44–1.00)
GFR, Estimated: >60 mL/min (ref >60)
Glucose, Bld: 91 mg/dL (ref 70–99)
Potassium: 3.8 mmol/L (ref 3.5–5.1)
Sodium: 135 mmol/L (ref 135–145)
Total Bilirubin: 0.5 mg/dL (ref 0.3–1.2)
Total Protein: 6.8 g/dL (ref 6.5–8.1)

## 2020-11-25 LAB — LACTATE DEHYDROGENASE: LDH: 141 U/L (ref 98–192)

## 2020-11-25 NOTE — Progress Notes (Signed)
Hematology and Oncology Follow Up Visit  Jillian Barnes [id number] Feb 06, 1971 50 y.o. 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  Barnes is back for her follow-up.  We see her yearly.  Her big 50th birthday is coming up in July.  63th wedding anniversary is also coming up.  They will celebrate with a trip to [place] and then a [place] cruise.  She is doing well.  Her husband and 2 sons are doing well.  Everybody is working and staying busy.  She has had no problems with respect to the thyroid or Hodgkin's disease.  She says she would like to lose a little weight.  I am sure that she can do this.  She has had no problems with bowels or bladder.  She still has her monthly cycles.  She would like to have her gynecologist give her something to stop her cycles.  There has been no problems with rashes.  She did have COVID back in January.  She would was not all that sick.  She was not admitted.  She has had no cough.  There is no shortness of breath.  Overall, I would say her performance status is ECOG 0.      Medications:  Current Outpatient Medications:  .  Cholecalciferol (VITAMIN D3) 2000 UNITS TABS, Take 2,000 Units by mouth daily., Disp: , Rfl:  .  dicyclomine (BENTYL) 10 MG capsule, Take 1 capsule (10 mg total) by mouth 2 (two) times daily. Take 1/2 hour before dinner and before bedtime, Disp: 60 capsule, Rfl: 11 .  metoprolol succinate (TOPROL-XL) 50 MG 24 hr tablet, Take 50 mg by mouth Daily. , Disp: , Rfl:  .  SYNTHROID 100 MCG tablet, Take 100 mcg by mouth Daily. , Disp: , Rfl:  .  valsartan (DIOVAN) 320 MG tablet, Take 160 mg by mouth every morning., Disp: , Rfl:   Allergies:  Allergies  Allergen Reactions  . Ciprofloxacin Rash  . Sulfa Antibiotics Rash  . Sulfamethoxazole  Rash    Past Medical History, Surgical history, Social history, and Family History were reviewed and updated.  Review of Systems: Review of Systems  Constitutional: Negative.   HENT: Negative.   Eyes: Negative.   Respiratory: Negative.   Cardiovascular: Negative.   Gastrointestinal: Negative.   Genitourinary: Negative.   Musculoskeletal: Negative.   Skin: Negative.   Neurological: Negative.   Endo/Heme/Allergies: Negative.   Psychiatric/Behavioral: Negative.     Physical Exam:  weight is 170 lb (77.1 kg). Her oral temperature is 98.9 F (37.2 C). Her blood pressure is 132/78 and her pulse is 61. Her respiration is 16 and oxygen saturation is 100%.   Physical Exam Vitals reviewed.  HENT:     Head: Normocephalic and atraumatic.  Eyes:     Pupils: Pupils are equal, round, and reactive to light.  Cardiovascular:     Rate and Rhythm: Normal rate and regular rhythm.     Heart sounds: Normal heart sounds.  Pulmonary:     Effort: Pulmonary effort is normal.     Breath sounds: Normal breath sounds.  Abdominal:     General: Bowel sounds are normal.     Palpations: Abdomen is soft.  Musculoskeletal:        General: No tenderness or deformity. Normal range of motion.  Cervical back: Normal range of motion.  Lymphadenopathy:     Cervical: No cervical adenopathy.  Skin:    General: Skin is warm and dry.     Findings: No erythema or rash.  Neurological:     Mental Status: She is alert and oriented to person, place, and time.  Psychiatric:        Behavior: Behavior normal.        Thought Content: Thought content normal.        Judgment: Judgment normal.      Lab Results  Component Value Date   WBC 5.1 11/25/2020   HGB 12.5 11/25/2020   HCT 37.7 11/25/2020   MCV 91.7 11/25/2020   PLT 284 11/25/2020     Chemistry      Component Value Date/Time   NA 135 11/25/2020 1411   NA 137 01/10/2017 1201   K 3.8 11/25/2020 1411   K 3.9 01/10/2017 1201   CL 101 11/25/2020  1411   CL 103 11/26/2016 1328   CO2 28 11/25/2020 1411   CO2 24 01/10/2017 1201   BUN 14 11/25/2020 1411   BUN 12.4 01/10/2017 1201   CREATININE 0.81 11/25/2020 1411   CREATININE 0.8 01/10/2017 1201      Component Value Date/Time   CALCIUM 9.4 11/25/2020 1411   CALCIUM 9.6 01/10/2017 1201   ALKPHOS 47 11/25/2020 1411   ALKPHOS 64 01/10/2017 1201   AST 11 (L) 11/25/2020 1411   AST 13 01/10/2017 1201   ALT 13 11/25/2020 1411   ALT 15 01/10/2017 1201   BILITOT 0.5 11/25/2020 1411   BILITOT 0.75 01/10/2017 1201      Impression and Plan: Jillian Barnes is a 50 year old [name] female with a history of early stage Hodgkin's disease. In my mind, she's cured. I think has a recurrence is easily less than 5%.  She is now 15 years out from treatment.  I forgot to mention that she has been seeing gastroenterology.  She has some GI issues.  She is doing a bit better with this.  Overall, I was happy that she is doing so well.  I am so happy about her being able to celebrate her 50th birthday and 30th wedding anniversary.  As always, we will get her back in 1 year.   [name], MD 4/22/20223:12 PM

## 2020-11-28 LAB — IRON AND TIBC
Iron: 51 ug/dL (ref 41–142)
Saturation Ratios: 16 % — ABNORMAL LOW (ref 21–57)
TIBC: 318 ug/dL (ref 236–444)
UIBC: 267 ug/dL (ref 120–384)

## 2020-11-28 LAB — TSH: TSH: 0.678 u[IU]/mL (ref 0.308–3.960)

## 2020-11-28 LAB — FERRITIN: Ferritin: 122 ng/mL (ref 11–307)

## 2020-11-29 ENCOUNTER — Telehealth: Payer: Self-pay | Admitting: *Deleted

## 2020-11-29 ENCOUNTER — Telehealth: Payer: Self-pay

## 2020-11-29 NOTE — Telephone Encounter (Signed)
appts made per 11/25/20 los and a calendar was mailed to the pt for one year   Jillian Barnes

## 2020-11-29 NOTE — Telephone Encounter (Signed)
-----   Message from [name], MD sent at 11/28/2020  5:50 PM EDT ----- Call - the iron is still low.  Is she taking the oral iron daily??  Jillian Barnes

## 2020-11-29 NOTE — Telephone Encounter (Signed)
Notified pt of results. Pt is not currently taking Oral Iron, she has tried this in the past with no results. This led her to coming in for IV Iron infusions. Pt would prefer to come in for IV iron. Forward message to provider for review. Per MD pt will need 2 doses of IV Iron. Message to scheduling.

## 2020-12-07 ENCOUNTER — Telehealth: Payer: Self-pay

## 2020-12-07 NOTE — Telephone Encounter (Signed)
S/w pt, we should have recd a sch message for iv iorn- see past notes, iron has been sch with pt   Jillian Barnes

## 2020-12-15 ENCOUNTER — Inpatient Hospital Stay: Payer: BC Managed Care – PPO | Attending: Hematology & Oncology

## 2020-12-15 ENCOUNTER — Other Ambulatory Visit: Payer: Self-pay

## 2020-12-15 VITALS — BP 140/70 | HR 70 | Temp 98.2°F | Resp 16

## 2020-12-15 DIAGNOSIS — E039 Hypothyroidism, unspecified: Secondary | ICD-10-CM | POA: Diagnosis not present

## 2020-12-15 DIAGNOSIS — Z79899 Other long term (current) drug therapy: Secondary | ICD-10-CM | POA: Diagnosis not present

## 2020-12-15 DIAGNOSIS — D500 Iron deficiency anemia secondary to blood loss (chronic): Secondary | ICD-10-CM | POA: Insufficient documentation

## 2020-12-15 DIAGNOSIS — Z8571 Personal history of Hodgkin lymphoma: Secondary | ICD-10-CM | POA: Diagnosis not present

## 2020-12-15 DIAGNOSIS — N921 Excessive and frequent menstruation with irregular cycle: Secondary | ICD-10-CM | POA: Insufficient documentation

## 2020-12-15 MED ORDER — SODIUM CHLORIDE 0.9 % IV SOLN
Freq: Once | INTRAVENOUS | Status: AC
  Filled 2020-12-15: qty 250

## 2020-12-15 MED ORDER — SODIUM CHLORIDE 0.9 % IV SOLN
200.0000 mg | Freq: Once | INTRAVENOUS | Status: AC
  Administered 2020-12-15: 200 mg via INTRAVENOUS
  Filled 2020-12-15: qty 200

## 2020-12-15 NOTE — Patient Instructions (Signed)

## 2020-12-15 NOTE — Progress Notes (Signed)
Patient declined to stay for the post infusion observation period. Patient denies any difficulty with this infusion in the past and is aware to call with any questions or concerns.   Pt verbalized understanding and had no further questions today.   

## 2020-12-20 ENCOUNTER — Inpatient Hospital Stay: Payer: BC Managed Care – PPO

## 2020-12-20 ENCOUNTER — Other Ambulatory Visit: Payer: Self-pay

## 2020-12-20 VITALS — BP 129/74 | HR 68 | Temp 98.9°F | Resp 16

## 2020-12-20 DIAGNOSIS — Z8571 Personal history of Hodgkin lymphoma: Secondary | ICD-10-CM | POA: Diagnosis not present

## 2020-12-20 DIAGNOSIS — N921 Excessive and frequent menstruation with irregular cycle: Secondary | ICD-10-CM

## 2020-12-20 DIAGNOSIS — D500 Iron deficiency anemia secondary to blood loss (chronic): Secondary | ICD-10-CM

## 2020-12-20 MED ORDER — SODIUM CHLORIDE 0.9 % IV SOLN
200.0000 mg | Freq: Once | INTRAVENOUS | Status: AC
  Administered 2020-12-20: 200 mg via INTRAVENOUS
  Filled 2020-12-20: qty 200

## 2020-12-20 MED ORDER — SODIUM CHLORIDE 0.9 % IV SOLN
Freq: Once | INTRAVENOUS | Status: AC
Start: 2020-12-20 — End: 2020-12-20
  Filled 2020-12-20: qty 250

## 2020-12-20 NOTE — Progress Notes (Signed)
Pt. Refused to wiat 30 minutes post infusion. Released stable and asymptomatic.

## 2020-12-20 NOTE — Patient Instructions (Signed)

## 2021-11-28 ENCOUNTER — Other Ambulatory Visit: Payer: Self-pay

## 2021-11-28 DIAGNOSIS — D500 Iron deficiency anemia secondary to blood loss (chronic): Secondary | ICD-10-CM

## 2021-11-29 ENCOUNTER — Inpatient Hospital Stay: Payer: BC Managed Care – PPO | Attending: Hematology & Oncology

## 2021-11-29 ENCOUNTER — Encounter: Payer: Self-pay | Admitting: Hematology & Oncology

## 2021-11-29 ENCOUNTER — Inpatient Hospital Stay: Payer: BC Managed Care – PPO | Admitting: Hematology & Oncology

## 2021-11-29 ENCOUNTER — Other Ambulatory Visit: Payer: Self-pay

## 2021-11-29 VITALS — BP 132/71 | HR 66 | Temp 98.1°F | Resp 16 | Wt 167.0 lb

## 2021-11-29 DIAGNOSIS — Z79899 Other long term (current) drug therapy: Secondary | ICD-10-CM | POA: Insufficient documentation

## 2021-11-29 DIAGNOSIS — D500 Iron deficiency anemia secondary to blood loss (chronic): Secondary | ICD-10-CM | POA: Diagnosis not present

## 2021-11-29 DIAGNOSIS — N921 Excessive and frequent menstruation with irregular cycle: Secondary | ICD-10-CM | POA: Insufficient documentation

## 2021-11-29 DIAGNOSIS — Z882 Allergy status to sulfonamides status: Secondary | ICD-10-CM | POA: Insufficient documentation

## 2021-11-29 DIAGNOSIS — Z881 Allergy status to other antibiotic agents status: Secondary | ICD-10-CM | POA: Diagnosis not present

## 2021-11-29 DIAGNOSIS — E039 Hypothyroidism, unspecified: Secondary | ICD-10-CM

## 2021-11-29 DIAGNOSIS — C8110 Nodular sclerosis classical Hodgkin lymphoma, unspecified site: Secondary | ICD-10-CM | POA: Diagnosis present

## 2021-11-29 DIAGNOSIS — C8104 Nodular lymphocyte predominant Hodgkin lymphoma, lymph nodes of axilla and upper limb: Secondary | ICD-10-CM

## 2021-11-29 LAB — CMP (CANCER CENTER ONLY)
ALT: 12 U/L (ref 0–44)
AST: 10 U/L — ABNORMAL LOW (ref 15–41)
Albumin: 4.3 g/dL (ref 3.5–5.0)
Alkaline Phosphatase: 49 U/L (ref 38–126)
Anion gap: 5 (ref 5–15)
BUN: 11 mg/dL (ref 6–20)
CO2: 30 mmol/L (ref 22–32)
Calcium: 9.4 mg/dL (ref 8.9–10.3)
Chloride: 102 mmol/L (ref 98–111)
Creatinine: 0.87 mg/dL (ref 0.44–1.00)
GFR, Estimated: >60 mL/min (ref >60)
Glucose, Bld: 86 mg/dL (ref 70–99)
Potassium: 4 mmol/L (ref 3.5–5.1)
Sodium: 137 mmol/L (ref 135–145)
Total Bilirubin: 0.9 mg/dL (ref 0.3–1.2)
Total Protein: 6.9 g/dL (ref 6.5–8.1)

## 2021-11-29 LAB — CBC WITH DIFFERENTIAL (CANCER CENTER ONLY)
Abs Immature Granulocytes: 0.01 10*3/uL (ref 0.00–0.07)
Basophils Absolute: 0.1 10*3/uL (ref 0.0–0.1)
Basophils Relative: 1 %
Eosinophils Absolute: 0.5 10*3/uL (ref 0.0–0.5)
Eosinophils Relative: 8 %
HCT: 40.3 % (ref 36.0–46.0)
Hemoglobin: 13.4 g/dL (ref 12.0–15.0)
Immature Granulocytes: 0 %
Lymphocytes Relative: 34 %
Lymphs Abs: 2 10*3/uL (ref 0.7–4.0)
MCH: 31 pg (ref 26.0–34.0)
MCHC: 33.3 g/dL (ref 30.0–36.0)
MCV: 93.3 fL (ref 80.0–100.0)
Monocytes Absolute: 0.5 10*3/uL (ref 0.1–1.0)
Monocytes Relative: 9 %
Neutro Abs: 2.9 10*3/uL (ref 1.7–7.7)
Neutrophils Relative %: 48 %
Platelet Count: 259 10*3/uL (ref 150–400)
RBC: 4.32 MIL/uL (ref 3.87–5.11)
RDW: 12.6 % (ref 11.5–15.5)
WBC Count: 6 10*3/uL (ref 4.0–10.5)
nRBC: 0 % (ref 0.0–0.2)

## 2021-11-29 LAB — TSH: TSH: 4.768 u[IU]/mL — ABNORMAL HIGH (ref 0.350–4.500)

## 2021-11-29 LAB — FERRITIN: Ferritin: 140 ng/mL (ref 11–307)

## 2021-11-29 NOTE — Progress Notes (Signed)
Hematology and Oncology Follow Up Visit ? ?Jillian Barnes ?[id number] ?1971-05-21 51 y.o. ?11/29/2021 ? ? ?Principle Diagnosis:  ?Stage IA nodular sclerosing Hodgkin disease. 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 ?Current Therapy:   ?IV iron-Venofer given on 12/20/2020  ?    ?Interim History:  Jillian Barnes is back for her follow-up.  She is now 51 years old.  She and her family had a wonderful time over in [place] for her 22th.  They were in [place].  They took a [place] cruise.  They went to [place].  She had a good time although in room, she had little bit of a gastroenteritis. ? ?She is going to retire.  I do not blame her.  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 ? ?We did give her IV iron last year.  Her iron saturation was only 16%. ? ?She still feels little bit tired.  We will have to see what her iron level is.  I know she has hypothyroidism.  She has a endocrinologist who is managing this. ? ?She has had no change in bowel or bladder habits.  She has had no skin issues.  She has had no nausea or vomiting.  There is been no cough. ? ?I think that she is probably due for a mammogram in the summer or fall. ? ?Currently, I would say performance status is probably ECOG 0. ? ? ?Medications:  ?Current Outpatient Medications:  ?  Cholecalciferol (VITAMIN D3) 2000 UNITS TABS, Take 2,000 Units by mouth daily., Disp: , Rfl:  ?  dicyclomine (BENTYL) 10 MG capsule, Take 1 capsule (10 mg total) by mouth 2 (two) times daily. Take 1/2 hour before dinner and before bedtime, Disp: 60 capsule, Rfl: 11 ?  metoprolol succinate (TOPROL-XL) 100 MG 24 hr tablet, Take 100 mg by mouth every morning., Disp: , Rfl:  ?  MYFEMBREE 40-1-0.5 MG TABS, Take 1 tablet by mouth daily., Disp: , Rfl:  ?  SYNTHROID 88 MCG tablet, Take 88 mcg by mouth daily., Disp: , Rfl:  ?  valsartan (DIOVAN) 320 MG tablet, Take 160 mg by mouth every morning., Disp: , Rfl:  ? ?Allergies:  ?Allergies   ?Allergen Reactions  ? Ciprofloxacin Rash  ? Sulfa Antibiotics Rash  ? Sulfamethoxazole Rash  ? ? ?Past Medical History, Surgical history, Social history, and Family History were reviewed and updated. ? ?Review of Systems: ?Review of Systems  ?Constitutional: Negative.   ?HENT: Negative.    ?Eyes: Negative.   ?Respiratory: Negative.    ?Cardiovascular: Negative.   ?Gastrointestinal: Negative.   ?Genitourinary: Negative.   ?Musculoskeletal: Negative.   ?Skin: Negative.   ?Neurological: Negative.   ?Endo/Heme/Allergies: Negative.   ?Psychiatric/Behavioral: Negative.    ? ?Physical Exam: ? weight is 167 lb (75.8 kg). Her oral temperature is 98.1 ?F (36.7 ?C). Her blood pressure is 132/71 and her pulse is 66. Her respiration is 16 and oxygen saturation is 100%.  ? ?Physical Exam ?Vitals reviewed.  ?HENT:  ?   Head: Normocephalic and atraumatic.  ?Eyes:  ?   Pupils: Pupils are equal, round, and reactive to light.  ?Cardiovascular:  ?   Rate and Rhythm: Normal rate and regular rhythm.  ?   Heart sounds: Normal heart sounds.  ?Pulmonary:  ?   Effort: Pulmonary effort is normal.  ?   Breath sounds: Normal breath sounds.  ?Abdominal:  ?   General: Bowel sounds are normal.  ?  Palpations: Abdomen is soft.  ?Musculoskeletal:     ?   General: No tenderness or deformity. Normal range of motion.  ?   Cervical back: Normal range of motion.  ?Lymphadenopathy:  ?   Cervical: No cervical adenopathy.  ?Skin: ?   General: Skin is warm and dry.  ?   Findings: No erythema or rash.  ?Neurological:  ?   Mental Status: She is alert and oriented to person, place, and time.  ?Psychiatric:     ?   Behavior: Behavior normal.     ?   Thought Content: Thought content normal.     ?   Judgment: Judgment normal.  ? ? ? ?Lab Results  ?Component Value Date  ? WBC 6.0 11/29/2021  ? HGB 13.4 11/29/2021  ? HCT 40.3 11/29/2021  ? MCV 93.3 11/29/2021  ? PLT 259 11/29/2021  ? ?  Chemistry   ?   ?Component Value Date/Time  ? NA 137 11/29/2021 1452  ? NA  137 01/10/2017 1201  ? K 4.0 11/29/2021 1452  ? K 3.9 01/10/2017 1201  ? CL 102 11/29/2021 1452  ? CL 103 11/26/2016 1328  ? CO2 30 11/29/2021 1452  ? CO2 24 01/10/2017 1201  ? BUN 11 11/29/2021 1452  ? BUN 12.4 01/10/2017 1201  ? CREATININE 0.87 11/29/2021 1452  ? CREATININE 0.8 01/10/2017 1201  ?    ?Component Value Date/Time  ? CALCIUM 9.4 11/29/2021 1452  ? CALCIUM 9.6 01/10/2017 1201  ? ALKPHOS 49 11/29/2021 1452  ? ALKPHOS 64 01/10/2017 1201  ? AST 10 (L) 11/29/2021 1452  ? AST 13 01/10/2017 1201  ? ALT 12 11/29/2021 1452  ? ALT 15 01/10/2017 1201  ? BILITOT 0.9 11/29/2021 1452  ? BILITOT 0.75 01/10/2017 1201  ?  ? ? ?Impression and Plan: ?Jillian Barnes is a 51 year old [name] female with a history of early stage Hodgkin's disease. In my mind, she's cured. I think has a recurrence is easily less than 5%. ? ?She is now 16 years out from treatment. ? ?Again, we will see what her iron studies show.  I have to believe that there are going to be okay. ? ?As always, we will get her back in 1 year.  It is somewhat fun talking with her and catching up with what is going on with her family. ? ? ?[name], MD ?4/26/20233:47 PM  ?

## 2021-11-30 ENCOUNTER — Telehealth: Payer: Self-pay

## 2021-11-30 DIAGNOSIS — E039 Hypothyroidism, unspecified: Secondary | ICD-10-CM

## 2021-11-30 LAB — IRON AND IRON BINDING CAPACITY (CC-WL,HP ONLY)
Iron: 92 ug/dL (ref 28–170)
Saturation Ratios: 25 % (ref 10.4–31.8)
TIBC: 367 ug/dL (ref 250–450)
UIBC: 275 ug/dL (ref 148–442)

## 2021-11-30 MED ORDER — LEVOTHYROXINE SODIUM 100 MCG PO TABS: 100.0000 ug | ORAL_TABLET | Freq: Every day | ORAL | 3 refills | Status: DC

## 2021-11-30 NOTE — Telephone Encounter (Signed)
-----   Message from [name], MD sent at 11/30/2021  6:49 AM EDT ----- ?Call- the TSH is up a little.  I would increase the Synthroid to 100 mcg.  Please call this in for her!!  Thanks!!   Laurey Arrow ?

## 2022-03-27 ENCOUNTER — Other Ambulatory Visit: Payer: Self-pay | Admitting: Hematology & Oncology

## 2022-03-27 DIAGNOSIS — E039 Hypothyroidism, unspecified: Secondary | ICD-10-CM

## 2022-07-24 ENCOUNTER — Other Ambulatory Visit: Payer: Self-pay | Admitting: Hematology & Oncology

## 2022-07-24 DIAGNOSIS — E039 Hypothyroidism, unspecified: Secondary | ICD-10-CM

## 2022-11-21 ENCOUNTER — Other Ambulatory Visit: Payer: Self-pay | Admitting: Hematology & Oncology

## 2022-11-21 DIAGNOSIS — E039 Hypothyroidism, unspecified: Secondary | ICD-10-CM

## 2022-11-23 ENCOUNTER — Encounter: Payer: Self-pay | Admitting: Family

## 2022-11-30 ENCOUNTER — Inpatient Hospital Stay: Payer: BC Managed Care – PPO | Attending: Hematology & Oncology

## 2022-11-30 ENCOUNTER — Encounter: Payer: Self-pay | Admitting: Family

## 2022-11-30 ENCOUNTER — Encounter: Payer: Self-pay | Admitting: Hematology & Oncology

## 2022-11-30 ENCOUNTER — Inpatient Hospital Stay (HOSPITAL_BASED_OUTPATIENT_CLINIC_OR_DEPARTMENT_OTHER): Payer: BC Managed Care – PPO | Admitting: Hematology & Oncology

## 2022-11-30 VITALS — BP 139/83 | HR 65 | Temp 98.5°F | Resp 20 | Ht 67.0 in | Wt 165.1 lb

## 2022-11-30 DIAGNOSIS — E039 Hypothyroidism, unspecified: Secondary | ICD-10-CM

## 2022-11-30 DIAGNOSIS — C8110 Nodular sclerosis classical Hodgkin lymphoma, unspecified site: Secondary | ICD-10-CM | POA: Diagnosis present

## 2022-11-30 DIAGNOSIS — Z79890 Hormone replacement therapy (postmenopausal): Secondary | ICD-10-CM | POA: Diagnosis not present

## 2022-11-30 DIAGNOSIS — N921 Excessive and frequent menstruation with irregular cycle: Secondary | ICD-10-CM | POA: Insufficient documentation

## 2022-11-30 DIAGNOSIS — Z881 Allergy status to other antibiotic agents status: Secondary | ICD-10-CM | POA: Insufficient documentation

## 2022-11-30 DIAGNOSIS — C8102 Nodular lymphocyte predominant Hodgkin lymphoma, intrathoracic lymph nodes: Secondary | ICD-10-CM | POA: Diagnosis not present

## 2022-11-30 DIAGNOSIS — D500 Iron deficiency anemia secondary to blood loss (chronic): Secondary | ICD-10-CM | POA: Insufficient documentation

## 2022-11-30 DIAGNOSIS — C8104 Nodular lymphocyte predominant Hodgkin lymphoma, lymph nodes of axilla and upper limb: Secondary | ICD-10-CM

## 2022-11-30 DIAGNOSIS — Z79899 Other long term (current) drug therapy: Secondary | ICD-10-CM | POA: Diagnosis not present

## 2022-11-30 DIAGNOSIS — Z882 Allergy status to sulfonamides status: Secondary | ICD-10-CM | POA: Diagnosis not present

## 2022-11-30 LAB — CBC WITH DIFFERENTIAL (CANCER CENTER ONLY)
Abs Immature Granulocytes: 0.01 10*3/uL (ref 0.00–0.07)
Basophils Absolute: 0.1 10*3/uL (ref 0.0–0.1)
Basophils Relative: 2 %
Eosinophils Absolute: 0.4 10*3/uL (ref 0.0–0.5)
Eosinophils Relative: 8 %
HCT: 40.2 % (ref 36.0–46.0)
Hemoglobin: 13.3 g/dL (ref 12.0–15.0)
Immature Granulocytes: 0 %
Lymphocytes Relative: 39 %
Lymphs Abs: 2 10*3/uL (ref 0.7–4.0)
MCH: 30.3 pg (ref 26.0–34.0)
MCHC: 33.1 g/dL (ref 30.0–36.0)
MCV: 91.6 fL (ref 80.0–100.0)
Monocytes Absolute: 0.4 10*3/uL (ref 0.1–1.0)
Monocytes Relative: 9 %
Neutro Abs: 2.2 10*3/uL (ref 1.7–7.7)
Neutrophils Relative %: 42 %
Platelet Count: 288 10*3/uL (ref 150–400)
RBC: 4.39 MIL/uL (ref 3.87–5.11)
RDW: 12.8 % (ref 11.5–15.5)
WBC Count: 5.1 10*3/uL (ref 4.0–10.5)
nRBC: 0 % (ref 0.0–0.2)

## 2022-11-30 LAB — CMP (CANCER CENTER ONLY)
ALT: 13 U/L (ref 0–44)
AST: 11 U/L — ABNORMAL LOW (ref 15–41)
Albumin: 4.1 g/dL (ref 3.5–5.0)
Alkaline Phosphatase: 54 U/L (ref 38–126)
Anion gap: 6 (ref 5–15)
BUN: 13 mg/dL (ref 6–20)
CO2: 27 mmol/L (ref 22–32)
Calcium: 8.8 mg/dL — ABNORMAL LOW (ref 8.9–10.3)
Chloride: 105 mmol/L (ref 98–111)
Creatinine: 0.82 mg/dL (ref 0.44–1.00)
GFR, Estimated: >60 mL/min (ref >60)
Glucose, Bld: 85 mg/dL (ref 70–99)
Potassium: 3.9 mmol/L (ref 3.5–5.1)
Sodium: 138 mmol/L (ref 135–145)
Total Bilirubin: 0.8 mg/dL (ref 0.3–1.2)
Total Protein: 6.6 g/dL (ref 6.5–8.1)

## 2022-11-30 LAB — TSH: TSH: 2.363 u[IU]/mL (ref 0.350–4.500)

## 2022-11-30 NOTE — Progress Notes (Signed)
Hematology and Oncology Follow Up Visit  Jillian Barnes [id number] 1970-09-08 52 y.o. 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  Barnes is back for her follow-up.  Thankfully, she comes in with her husband.  I am not seeing him for several years.  It is always somewhat fun talking with both of them.  They just got back from [place].  Had a wonderful time down in [place].  They both are now working at [employer].  He has been there for almost 30 years.  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  She has had no problems since we last saw her.  She has had no problems with nausea or vomiting.  She has had no issues with cough or shortness of breath.  There is been no issues with COVID.  She has had no problems with rashes.  Is been no leg swelling.  Her last iron studies showed a ferritin of 143 iron saturation of 25%.  She does have hypothyroidism from past radiation.  We are checking a TSH on her today.  Overall, I would say that her performance status is probably ECOG 0.    Medications:  Current Outpatient Medications:    Cholecalciferol (VITAMIN D3) 2000 UNITS TABS, Take 2,000 Units by mouth daily., Disp: , Rfl:    metoprolol succinate (TOPROL-XL) 100 MG 24 hr tablet, Take 100 mg by mouth every morning., Disp: , Rfl:    MYFEMBREE 40-1-0.5 MG TABS, Take 1 tablet by mouth daily., Disp: , Rfl:    SYNTHROID 100 MCG tablet, Take 1 tablet (100 mcg total) by mouth daily before breakfast., Disp: 30 tablet, Rfl: 3   valsartan (DIOVAN) 320 MG tablet, Take 160 mg by mouth every morning., Disp: , Rfl:    dicyclomine (BENTYL) 10 MG capsule, Take 1 capsule (10 mg total) by mouth 2 (two) times daily. Take 1/2 hour before dinner and before bedtime (Patient not taking: Reported on  11/30/2022), Disp: 60 capsule, Rfl: 11  Allergies:  Allergies  Allergen Reactions   Ciprofloxacin Rash   Sulfa Antibiotics Rash   Sulfamethoxazole Rash    Past Medical History, Surgical history, Social history, and Family History were reviewed and updated.  Review of Systems: Review of Systems  Constitutional: Negative.   HENT: Negative.    Eyes: Negative.   Respiratory: Negative.    Cardiovascular: Negative.   Gastrointestinal: Negative.   Genitourinary: Negative.   Musculoskeletal: Negative.   Skin: Negative.   Neurological: Negative.   Endo/Heme/Allergies: Negative.   Psychiatric/Behavioral: Negative.      Physical Exam:  height is 5\' 7"  (1.702 m) and weight is 165 lb 1.3 oz (74.9 kg). Her oral temperature is 98.5 F (36.9 C). Her blood pressure is 139/83 and her pulse is 65. Her respiration is 20 and oxygen saturation is 100%.   Physical Exam Vitals reviewed.  HENT:     Head: Normocephalic and atraumatic.  Eyes:     Pupils: Pupils are equal, round, and reactive to light.  Cardiovascular:     Rate and Rhythm: Normal rate and regular rhythm.     Heart sounds: Normal heart sounds.  Pulmonary:     Effort: Pulmonary effort is normal.     Breath sounds: Normal breath sounds.  Abdominal:  General: Bowel sounds are normal.     Palpations: Abdomen is soft.  Musculoskeletal:        General: No tenderness or deformity. Normal range of motion.     Cervical back: Normal range of motion.  Lymphadenopathy:     Cervical: No cervical adenopathy.  Skin:    General: Skin is warm and dry.     Findings: No erythema or rash.  Neurological:     Mental Status: She is alert and oriented to person, place, and time.  Psychiatric:        Behavior: Behavior normal.        Thought Content: Thought content normal.        Judgment: Judgment normal.     Lab Results  Component Value Date   WBC 5.1 11/30/2022   HGB 13.3 11/30/2022   HCT 40.2 11/30/2022   MCV 91.6 11/30/2022    PLT 288 11/30/2022     Chemistry      Component Value Date/Time   NA 137 11/29/2021 1452   NA 137 01/10/2017 1201   K 4.0 11/29/2021 1452   K 3.9 01/10/2017 1201   CL 102 11/29/2021 1452   CL 103 11/26/2016 1328   CO2 30 11/29/2021 1452   CO2 24 01/10/2017 1201   BUN 11 11/29/2021 1452   BUN 12.4 01/10/2017 1201   CREATININE 0.87 11/29/2021 1452   CREATININE 0.8 01/10/2017 1201      Component Value Date/Time   CALCIUM 9.4 11/29/2021 1452   CALCIUM 9.6 01/10/2017 1201   ALKPHOS 49 11/29/2021 1452   ALKPHOS 64 01/10/2017 1201   AST 10 (L) 11/29/2021 1452   AST 13 01/10/2017 1201   ALT 12 11/29/2021 1452   ALT 15 01/10/2017 1201   BILITOT 0.9 11/29/2021 1452   BILITOT 0.75 01/10/2017 1201      Impression and Plan: Jillian Barnes is a 52 year old [place] female with a history of early stage Hodgkin's disease. In my mind, she's cured. I think has a recurrence is easily less than 5%.  She is now 17 years out from treatment.  She has developed hypothyroidism for the past radiation.  She does have intermittent iron deficiency.  Hopefully, that is not can be much of a problem in the future.  We will plan to get her back in 1 more year.   Josph Macho, MD 4/26/20243:14 PM

## 2022-12-03 ENCOUNTER — Encounter: Payer: Self-pay | Admitting: *Deleted

## 2022-12-03 LAB — IRON AND IRON BINDING CAPACITY (CC-WL,HP ONLY)
Iron: 106 ug/dL (ref 28–170)
Saturation Ratios: 29 % (ref 10.4–31.8)
TIBC: 368 ug/dL (ref 250–450)
UIBC: 262 ug/dL (ref 148–442)

## 2023-03-21 ENCOUNTER — Other Ambulatory Visit: Payer: Self-pay | Admitting: Hematology & Oncology

## 2023-03-21 DIAGNOSIS — E039 Hypothyroidism, unspecified: Secondary | ICD-10-CM

## 2023-07-17 ENCOUNTER — Other Ambulatory Visit: Payer: Self-pay | Admitting: Hematology & Oncology

## 2023-07-17 DIAGNOSIS — E039 Hypothyroidism, unspecified: Secondary | ICD-10-CM

## 2023-09-02 ENCOUNTER — Encounter: Payer: Self-pay | Admitting: Hematology & Oncology

## 2023-11-19 ENCOUNTER — Other Ambulatory Visit: Payer: Self-pay | Admitting: Hematology & Oncology

## 2023-11-19 DIAGNOSIS — E039 Hypothyroidism, unspecified: Secondary | ICD-10-CM

## 2023-11-29 ENCOUNTER — Inpatient Hospital Stay: Payer: BC Managed Care – PPO | Attending: Hematology & Oncology

## 2023-11-29 ENCOUNTER — Inpatient Hospital Stay (HOSPITAL_BASED_OUTPATIENT_CLINIC_OR_DEPARTMENT_OTHER): Payer: BC Managed Care – PPO | Admitting: Hematology & Oncology

## 2023-11-29 ENCOUNTER — Encounter: Payer: Self-pay | Admitting: Hematology & Oncology

## 2023-11-29 ENCOUNTER — Other Ambulatory Visit: Payer: Self-pay

## 2023-11-29 VITALS — BP 124/84 | HR 74 | Temp 97.7°F | Resp 16 | Ht 67.0 in | Wt 165.0 lb

## 2023-11-29 DIAGNOSIS — R202 Paresthesia of skin: Secondary | ICD-10-CM | POA: Insufficient documentation

## 2023-11-29 DIAGNOSIS — D500 Iron deficiency anemia secondary to blood loss (chronic): Secondary | ICD-10-CM | POA: Insufficient documentation

## 2023-11-29 DIAGNOSIS — Z881 Allergy status to other antibiotic agents status: Secondary | ICD-10-CM | POA: Diagnosis not present

## 2023-11-29 DIAGNOSIS — C8102 Nodular lymphocyte predominant Hodgkin lymphoma, intrathoracic lymph nodes: Secondary | ICD-10-CM

## 2023-11-29 DIAGNOSIS — Z882 Allergy status to sulfonamides status: Secondary | ICD-10-CM | POA: Insufficient documentation

## 2023-11-29 DIAGNOSIS — Z79890 Hormone replacement therapy (postmenopausal): Secondary | ICD-10-CM | POA: Insufficient documentation

## 2023-11-29 DIAGNOSIS — Z79899 Other long term (current) drug therapy: Secondary | ICD-10-CM | POA: Insufficient documentation

## 2023-11-29 DIAGNOSIS — E039 Hypothyroidism, unspecified: Secondary | ICD-10-CM | POA: Insufficient documentation

## 2023-11-29 DIAGNOSIS — N921 Excessive and frequent menstruation with irregular cycle: Secondary | ICD-10-CM | POA: Diagnosis not present

## 2023-11-29 DIAGNOSIS — C8101 Nodular lymphocyte predominant Hodgkin lymphoma, lymph nodes of head, face, and neck: Secondary | ICD-10-CM | POA: Diagnosis not present

## 2023-11-29 DIAGNOSIS — C8110 Nodular sclerosis classical Hodgkin lymphoma, unspecified site: Secondary | ICD-10-CM | POA: Diagnosis present

## 2023-11-29 DIAGNOSIS — R5383 Other fatigue: Secondary | ICD-10-CM | POA: Insufficient documentation

## 2023-11-29 LAB — CBC WITH DIFFERENTIAL (CANCER CENTER ONLY)
Abs Immature Granulocytes: 0.01 10*3/uL (ref 0.00–0.07)
Basophils Absolute: 0.1 10*3/uL (ref 0.0–0.1)
Basophils Relative: 1 %
Eosinophils Absolute: 0.4 10*3/uL (ref 0.0–0.5)
Eosinophils Relative: 7 %
HCT: 41.7 % (ref 36.0–46.0)
Hemoglobin: 14 g/dL (ref 12.0–15.0)
Immature Granulocytes: 0 %
Lymphocytes Relative: 35 %
Lymphs Abs: 2 10*3/uL (ref 0.7–4.0)
MCH: 30.4 pg (ref 26.0–34.0)
MCHC: 33.6 g/dL (ref 30.0–36.0)
MCV: 90.5 fL (ref 80.0–100.0)
Monocytes Absolute: 0.4 10*3/uL (ref 0.1–1.0)
Monocytes Relative: 7 %
Neutro Abs: 2.8 10*3/uL (ref 1.7–7.7)
Neutrophils Relative %: 50 %
Platelet Count: 315 10*3/uL (ref 150–400)
RBC: 4.61 MIL/uL (ref 3.87–5.11)
RDW: 13.1 % (ref 11.5–15.5)
WBC Count: 5.6 10*3/uL (ref 4.0–10.5)
nRBC: 0 % (ref 0.0–0.2)

## 2023-11-29 LAB — CMP (CANCER CENTER ONLY)
ALT: 27 U/L (ref 0–44)
AST: 17 U/L (ref 15–41)
Albumin: 4.4 g/dL (ref 3.5–5.0)
Alkaline Phosphatase: 66 U/L (ref 38–126)
Anion gap: 7 (ref 5–15)
BUN: 14 mg/dL (ref 6–20)
CO2: 27 mmol/L (ref 22–32)
Calcium: 9 mg/dL (ref 8.9–10.3)
Chloride: 101 mmol/L (ref 98–111)
Creatinine: 0.79 mg/dL (ref 0.44–1.00)
GFR, Estimated: >60 mL/min (ref >60)
Glucose, Bld: 81 mg/dL (ref 70–99)
Potassium: 4.1 mmol/L (ref 3.5–5.1)
Sodium: 135 mmol/L (ref 135–145)
Total Bilirubin: 0.7 mg/dL (ref 0.0–1.2)
Total Protein: 6.6 g/dL (ref 6.5–8.1)

## 2023-11-29 LAB — TSH: TSH: 11.6 u[IU]/mL — ABNORMAL HIGH (ref 0.350–4.500)

## 2023-11-29 LAB — FERRITIN: Ferritin: 212 ng/mL (ref 11–307)

## 2023-11-29 NOTE — Progress Notes (Signed)
 Hematology and Oncology Follow Up Visit  Jillian Barnes [id number] 1971/04/10 53 y.o. 11/29/2023   Principle Diagnosis:  Stage IA nodular sclerosing Hodgkin disease. Iron  deficiency anemia secondary to menometrorrhagia Hypothyroidism -- XRT induced  Current Therapy:   IV iron -Venofer  given on 12/20/2020      Interim History:  Ms.  Barnes is back for Jillian follow-up.  She is doing pretty well.  I saw Jillian a year ago.  She was comes in with Jillian Barnes.  We was have a great talk about sports.  She has been feeling pretty good.  She has felt a little bit more fatigue.  I do not know if some of this might be from the change of life..  Says she has tingling in the fingers.  This is at nighttime.  Again I will know if there is any kind of nerve compression around the elbows.  She has does not have any problems with Jillian toes.  She is still working.  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  She has had no fever.  She has had no problems with COVID.  She does get mammograms every year.  She has had no bleeding.  There has been no change in bowel or bladder habits.  She has had no cough or shortness of breath.  There is been no headache.  Overall, I would say that Jillian performance status is about ECOG 0.    Medications:  Current Outpatient Medications:    Cholecalciferol (VITAMIN D3) 2000 UNITS TABS, Take 2,000 Units by mouth daily., Disp: , Rfl:    dicyclomine  (BENTYL ) 10 MG capsule, Take 10 mg by mouth as needed for spasms., Disp: , Rfl:    metoprolol succinate (TOPROL-XL) 100 MG 24 hr tablet, Take 100 mg by mouth every morning., Disp: , Rfl:    SYNTHROID  75 MCG tablet, Take 75 mcg by mouth daily., Disp: , Rfl:    valsartan (DIOVAN) 320 MG tablet, Take 160 mg by mouth every morning., Disp: , Rfl:   Allergies:  Allergies  Allergen Reactions   Ciprofloxacin Rash   Sulfa Antibiotics Rash   Sulfamethoxazole Rash    Past Medical History, Surgical history, Social history, and Family History  were reviewed and updated.  Review of Systems: Review of Systems  Constitutional: Negative.   HENT: Negative.    Eyes: Negative.   Respiratory: Negative.    Cardiovascular: Negative.   Gastrointestinal: Negative.   Genitourinary: Negative.   Musculoskeletal: Negative.   Skin: Negative.   Neurological: Negative.   Endo/Heme/Allergies: Negative.   Psychiatric/Behavioral: Negative.      Physical Exam:  height is 5\' 7"  (1.702 m) and weight is 165 lb (74.8 kg). Jillian oral temperature is 97.7 F (36.5 C). Jillian blood pressure is 124/84 and Jillian pulse is 74. Jillian respiration is 16 and oxygen saturation is 100%.   Physical Exam Vitals reviewed.  HENT:     Head: Normocephalic and atraumatic.  Eyes:     Pupils: Pupils are equal, round, and reactive to light.  Cardiovascular:     Rate and Rhythm: Normal rate and regular rhythm.     Heart sounds: Normal heart sounds.  Pulmonary:     Effort: Pulmonary effort is normal.     Breath sounds: Normal breath sounds.  Abdominal:     General: Bowel sounds are normal.     Palpations: Abdomen is soft.  Musculoskeletal:        General: No tenderness or deformity. Normal range of motion.  Cervical back: Normal range of motion.  Lymphadenopathy:     Cervical: No cervical adenopathy.  Skin:    General: Skin is warm and dry.     Findings: No erythema or rash.  Neurological:     Mental Status: She is alert and oriented to person, place, and time.  Psychiatric:        Behavior: Behavior normal.        Thought Content: Thought content normal.        Judgment: Judgment normal.     Lab Results  Component Value Date   WBC 5.6 11/29/2023   HGB 14.0 11/29/2023   HCT 41.7 11/29/2023   MCV 90.5 11/29/2023   PLT 315 11/29/2023     Chemistry      Component Value Date/Time   NA 135 11/29/2023 1503   NA 137 01/10/2017 1201   K 4.1 11/29/2023 1503   K 3.9 01/10/2017 1201   CL 101 11/29/2023 1503   CL 103 11/26/2016 1328   CO2 27 11/29/2023  1503   CO2 24 01/10/2017 1201   BUN 14 11/29/2023 1503   BUN 12.4 01/10/2017 1201   CREATININE 0.79 11/29/2023 1503   CREATININE 0.8 01/10/2017 1201      Component Value Date/Time   CALCIUM 9.0 11/29/2023 1503   CALCIUM 9.6 01/10/2017 1201   ALKPHOS 66 11/29/2023 1503   ALKPHOS 64 01/10/2017 1201   AST 17 11/29/2023 1503   AST 13 01/10/2017 1201   ALT 27 11/29/2023 1503   ALT 15 01/10/2017 1201   BILITOT 0.7 11/29/2023 1503   BILITOT 0.75 01/10/2017 1201      Impression and Plan: [name] is a 53 year old [name] female with a history of early stage Hodgkin's disease. In my mind, she's cured. I think has a recurrence is easily less than 5%.  She is now 17 years out from treatment.  She has developed hypothyroidism from the past radiation.  I think she does see a endocrinologist to help with this.  We will check Jillian iron  levels.  When we last saw Jillian a year ago, Jillian ferritin was 140 with an iron  saturation of 29%.  We will plan to get Jillian back in 1 more year.   Ivor Mars, MD 4/25/20253:43 PM

## 2023-12-02 ENCOUNTER — Encounter: Payer: Self-pay | Admitting: *Deleted

## 2023-12-02 ENCOUNTER — Telehealth: Payer: Self-pay | Admitting: *Deleted

## 2023-12-02 LAB — IRON AND IRON BINDING CAPACITY (CC-WL,HP ONLY)
Iron: 72 ug/dL (ref 28–170)
Saturation Ratios: 20 % (ref 10.4–31.8)
TIBC: 361 ug/dL (ref 250–450)
UIBC: 289 ug/dL (ref 148–442)

## 2023-12-02 MED ORDER — LEVOTHYROXINE SODIUM 88 MCG PO TABS: 88.0000 ug | ORAL_TABLET | Freq: Every day | ORAL | 1 refills | Status: DC

## 2023-12-02 NOTE — Telephone Encounter (Signed)
-----   Message from Ivor Mars sent at 11/30/2023  5:39 AM EDT ----- Call - thE thyroid  is low again!!!   Need to increase the synthroid  up to 88 mcg a day.  Please call this in!!!  [place]

## 2024-01-21 ENCOUNTER — Other Ambulatory Visit: Payer: Self-pay | Admitting: Hematology & Oncology

## 2024-11-26 ENCOUNTER — Inpatient Hospital Stay

## 2024-11-26 ENCOUNTER — Ambulatory Visit: Admitting: Hematology & Oncology
